# Patient Record
Sex: Female | Born: 1935 | Race: White | Hispanic: No | Marital: Married | Smoking: Never smoker
Health system: Southern US, Community
[De-identification: ages and names within clinical notes are randomized; demographics above are authoritative.]

## PROBLEM LIST (undated history)

## (undated) DIAGNOSIS — K219 Gastro-esophageal reflux disease without esophagitis: Secondary | ICD-10-CM

## (undated) DIAGNOSIS — M1990 Unspecified osteoarthritis, unspecified site: Secondary | ICD-10-CM

## (undated) DIAGNOSIS — I639 Cerebral infarction, unspecified: Secondary | ICD-10-CM

## (undated) DIAGNOSIS — Z9889 Other specified postprocedural states: Secondary | ICD-10-CM

## (undated) DIAGNOSIS — R112 Nausea with vomiting, unspecified: Secondary | ICD-10-CM

## (undated) DIAGNOSIS — G709 Myoneural disorder, unspecified: Secondary | ICD-10-CM

## (undated) DIAGNOSIS — F419 Anxiety disorder, unspecified: Secondary | ICD-10-CM

## (undated) DIAGNOSIS — B001 Herpesviral vesicular dermatitis: Secondary | ICD-10-CM

## (undated) DIAGNOSIS — K859 Acute pancreatitis without necrosis or infection, unspecified: Secondary | ICD-10-CM

## (undated) DIAGNOSIS — K529 Noninfective gastroenteritis and colitis, unspecified: Secondary | ICD-10-CM

## (undated) HISTORY — PX: TONSILLECTOMY: SUR1361

## (undated) HISTORY — PX: DILATION AND CURETTAGE OF UTERUS: SHX78

## (undated) HISTORY — PX: APPENDECTOMY: SHX54

## (undated) HISTORY — PX: CHOLECYSTECTOMY: SHX55

## (undated) HISTORY — PX: BACK SURGERY: SHX140

## (undated) HISTORY — PX: ABDOMINAL HYSTERECTOMY: SHX81

---

## 1999-09-17 ENCOUNTER — Encounter: Payer: Self-pay | Admitting: Rheumatology

## 1999-09-17 ENCOUNTER — Encounter: Admission: RE | Admit: 1999-09-17 | Discharge: 1999-09-17 | Payer: Self-pay | Admitting: Rheumatology

## 2000-09-20 ENCOUNTER — Encounter: Payer: Self-pay | Admitting: Rheumatology

## 2000-09-20 ENCOUNTER — Encounter: Admission: RE | Admit: 2000-09-20 | Discharge: 2000-09-20 | Payer: Self-pay | Admitting: Rheumatology

## 2000-09-20 ENCOUNTER — Encounter: Admission: RE | Admit: 2000-09-20 | Discharge: 2000-09-20 | Payer: Self-pay | Admitting: Internal Medicine

## 2000-09-20 ENCOUNTER — Encounter: Payer: Self-pay | Admitting: Internal Medicine

## 2000-11-04 ENCOUNTER — Ambulatory Visit (HOSPITAL_COMMUNITY): Admission: RE | Admit: 2000-11-04 | Discharge: 2000-11-04 | Payer: Self-pay | Admitting: Gastroenterology

## 2001-09-21 ENCOUNTER — Encounter: Admission: RE | Admit: 2001-09-21 | Discharge: 2001-09-21 | Payer: Self-pay | Admitting: Internal Medicine

## 2001-09-21 ENCOUNTER — Encounter: Payer: Self-pay | Admitting: Internal Medicine

## 2003-03-13 ENCOUNTER — Encounter: Admission: RE | Admit: 2003-03-13 | Discharge: 2003-03-13 | Payer: Self-pay | Admitting: Internal Medicine

## 2003-03-13 ENCOUNTER — Encounter: Payer: Self-pay | Admitting: Internal Medicine

## 2003-09-13 ENCOUNTER — Encounter: Admission: RE | Admit: 2003-09-13 | Discharge: 2003-09-13 | Payer: Self-pay | Admitting: Internal Medicine

## 2004-02-27 ENCOUNTER — Encounter: Admission: RE | Admit: 2004-02-27 | Discharge: 2004-02-27 | Payer: Self-pay | Admitting: Internal Medicine

## 2004-03-31 ENCOUNTER — Encounter: Admission: RE | Admit: 2004-03-31 | Discharge: 2004-03-31 | Payer: Self-pay | Admitting: Internal Medicine

## 2005-05-11 ENCOUNTER — Encounter: Admission: RE | Admit: 2005-05-11 | Discharge: 2005-05-11 | Payer: Self-pay | Admitting: Internal Medicine

## 2006-07-19 ENCOUNTER — Encounter: Admission: RE | Admit: 2006-07-19 | Discharge: 2006-07-19 | Payer: Self-pay | Admitting: Internal Medicine

## 2007-08-02 ENCOUNTER — Encounter: Admission: RE | Admit: 2007-08-02 | Discharge: 2007-08-02 | Payer: Self-pay | Admitting: Internal Medicine

## 2007-08-11 ENCOUNTER — Encounter: Admission: RE | Admit: 2007-08-11 | Discharge: 2007-08-11 | Payer: Self-pay | Admitting: Internal Medicine

## 2007-10-06 ENCOUNTER — Encounter: Admission: RE | Admit: 2007-10-06 | Discharge: 2007-10-06 | Payer: Self-pay | Admitting: Gastroenterology

## 2008-08-05 ENCOUNTER — Encounter: Admission: RE | Admit: 2008-08-05 | Discharge: 2008-08-05 | Payer: Self-pay | Admitting: Internal Medicine

## 2009-08-06 ENCOUNTER — Encounter: Admission: RE | Admit: 2009-08-06 | Discharge: 2009-08-06 | Payer: Self-pay | Admitting: Internal Medicine

## 2010-09-10 ENCOUNTER — Observation Stay (HOSPITAL_COMMUNITY)
Admission: EM | Admit: 2010-09-10 | Discharge: 2010-09-11 | Disposition: A | Discharge status: Home / Self Care | Payer: MEDICARE | Attending: Internal Medicine | Admitting: Internal Medicine

## 2010-09-10 ENCOUNTER — Emergency Department (HOSPITAL_COMMUNITY): Payer: MEDICARE

## 2010-09-10 DIAGNOSIS — R61 Generalized hyperhidrosis: Secondary | ICD-10-CM | POA: Insufficient documentation

## 2010-09-10 DIAGNOSIS — R42 Dizziness and giddiness: Principal | ICD-10-CM | POA: Insufficient documentation

## 2010-09-10 DIAGNOSIS — K219 Gastro-esophageal reflux disease without esophagitis: Secondary | ICD-10-CM | POA: Insufficient documentation

## 2010-09-10 DIAGNOSIS — E119 Type 2 diabetes mellitus without complications: Secondary | ICD-10-CM | POA: Insufficient documentation

## 2010-09-10 DIAGNOSIS — F411 Generalized anxiety disorder: Secondary | ICD-10-CM | POA: Insufficient documentation

## 2010-09-10 DIAGNOSIS — E785 Hyperlipidemia, unspecified: Secondary | ICD-10-CM | POA: Insufficient documentation

## 2010-09-10 DIAGNOSIS — R0602 Shortness of breath: Secondary | ICD-10-CM | POA: Insufficient documentation

## 2010-09-10 DIAGNOSIS — R110 Nausea: Secondary | ICD-10-CM | POA: Insufficient documentation

## 2010-09-10 LAB — URINALYSIS, ROUTINE W REFLEX MICROSCOPIC
Ketones, ur: NEGATIVE mg/dL
Specific Gravity, Urine: 1.005 (ref 1.005–1.030)
Urine Glucose, Fasting: NEGATIVE mg/dL

## 2010-09-10 LAB — BASIC METABOLIC PANEL
BUN: 15 mg/dL (ref 6–23)
CO2: 25 mEq/L (ref 19–32)
Creatinine, Ser: 0.93 mg/dL (ref 0.4–1.2)
GFR calc non Af Amer: 59 mL/min — ABNORMAL LOW (ref >60)
Glucose, Bld: 105 mg/dL — ABNORMAL HIGH (ref 70–99)
Potassium: 4.6 mEq/L (ref 3.5–5.1)
Sodium: 141 mEq/L (ref 135–145)

## 2010-09-10 LAB — DIFFERENTIAL
Basophils Relative: 1 % (ref 0–1)
Eosinophils Relative: 5 % (ref 0–5)
Lymphocytes Relative: 34 % (ref 12–46)
Lymphs Abs: 2.3 10*3/uL (ref 0.7–4.0)
Monocytes Absolute: 0.6 10*3/uL (ref 0.1–1.0)
Monocytes Relative: 9 % (ref 3–12)
Neutro Abs: 3.5 10*3/uL (ref 1.7–7.7)
Neutrophils Relative %: 52 % (ref 43–77)

## 2010-09-10 LAB — POCT CARDIAC MARKERS
Myoglobin, poc: 73.5 ng/mL (ref 12–200)
Troponin i, poc: <0.05 ng/mL (ref 0.00–0.09)

## 2010-09-10 LAB — GLUCOSE, CAPILLARY

## 2010-09-10 LAB — CBC
HCT: 36.7 % (ref 36.0–46.0)
MCHC: 33 g/dL (ref 30.0–36.0)
Platelets: 258 10*3/uL (ref 150–400)

## 2010-09-11 ENCOUNTER — Other Ambulatory Visit (HOSPITAL_COMMUNITY): Payer: MEDICARE

## 2010-09-11 LAB — HEMOGLOBIN A1C
Hgb A1c MFr Bld: 5.9 % — ABNORMAL HIGH (ref <5.7)
Mean Plasma Glucose: 123 mg/dL — ABNORMAL HIGH (ref <117)

## 2010-09-11 LAB — LIPID PANEL
Cholesterol: 176 mg/dL (ref 0–200)
LDL Cholesterol: 88 mg/dL (ref 0–99)
Total CHOL/HDL Ratio: 3.4 RATIO
Triglycerides: 178 mg/dL — ABNORMAL HIGH (ref <150)

## 2010-09-11 LAB — COMPREHENSIVE METABOLIC PANEL
ALT: 22 U/L (ref 0–35)
BUN: 14 mg/dL (ref 6–23)
Calcium: 9.2 mg/dL (ref 8.4–10.5)
Creatinine, Ser: 0.95 mg/dL (ref 0.4–1.2)
GFR calc non Af Amer: 58 mL/min — ABNORMAL LOW (ref >60)
Glucose, Bld: 125 mg/dL — ABNORMAL HIGH (ref 70–99)
Sodium: 141 mEq/L (ref 135–145)
Total Protein: 6.1 g/dL (ref 6.0–8.3)

## 2010-09-11 LAB — CBC
Hemoglobin: 11.9 g/dL — ABNORMAL LOW (ref 12.0–15.0)
MCHC: 33 g/dL (ref 30.0–36.0)
Platelets: 281 10*3/uL (ref 150–400)
RBC: 3.76 MIL/uL — ABNORMAL LOW (ref 3.87–5.11)

## 2010-09-11 LAB — CARDIAC PANEL(CRET KIN+CKTOT+MB+TROPI)
CK, MB: 1.9 ng/mL (ref 0.3–4.0)
Relative Index: INVALID (ref 0.0–2.5)
Total CK: 73 U/L (ref 7–177)
Troponin I: 0.01 ng/mL (ref 0.00–0.06)

## 2010-09-11 LAB — GLUCOSE, CAPILLARY
Glucose-Capillary: 104 mg/dL — ABNORMAL HIGH (ref 70–99)
Glucose-Capillary: 133 mg/dL — ABNORMAL HIGH (ref 70–99)
Glucose-Capillary: 114 mg/dL — ABNORMAL HIGH (ref 70–99)

## 2010-09-11 LAB — MAGNESIUM: Magnesium: 2.3 mg/dL (ref 1.5–2.5)

## 2010-09-11 LAB — TSH: TSH: 5.049 u[IU]/mL — ABNORMAL HIGH (ref 0.350–4.500)

## 2010-09-11 LAB — TROPONIN I: Troponin I: <0.01 ng/mL (ref 0.00–0.06)

## 2010-09-11 LAB — CK TOTAL AND CKMB (NOT AT ARMC)
Relative Index: INVALID (ref 0.0–2.5)
Total CK: 78 U/L (ref 7–177)

## 2010-09-18 NOTE — Discharge Summary (Signed)
  [name], Debbie Montoya NO.:  [id number]  MEDICAL RECORD NO.:  [id number]           PATIENT TYPE:  I  LOCATION:  3738                         FACILITY:  [facility]  PHYSICIAN:  Theressa Millard, M.D.    DATE OF BIRTH:  Dec 14, 1935  DATE OF ADMISSION:  09/10/2010 DATE OF DISCHARGE:  09/11/2010                              DISCHARGE SUMMARY   ADMITTING DIAGNOSIS:  Dizziness.  DISCHARGE DIAGNOSES: 1. Lightheadedness, possible neurocardiogenic event. 2. Diabetes mellitus, well controlled, no evidence of hypo or     hyperglycemia. 3. Dyslipidemia. 4. Anxiety. 5. Gastroesophageal reflux disease.  The patient is a 75 year old white female who developed a couple hours of rather severe lightheadedness associated with clamminess and nausea. She eventually came to the emergency department when their automatic blood pressure cuff would not register blood pressure.  HOSPITAL COURSE:  The patient was admitted and initial CT scan of the brain showed no abnormality.  Her orthostatic blood pressure measurements did note about a 30-40 mmHg systolic drop when she stood up.  Subsequently overnight, she felt better without any specific intervention.  She did not get any IV fluids.  She did have numerous PACs and PVCs on her telemetry, but did not feel those symptomatically. She was therefore discharged in improved condition to resume her home medications and to observe.  If she has further spells, we will work her up further for neurocardiogenic syncope.  DISCHARGE MEDICATIONS: 1. ACTOplus met 15/500 one tablet daily. 2. Bupropion XL 300 mg once daily. 3. Fenofibrate 160 mg daily. 4. Meloxicam 15 mg daily. 5. Minocycline 100 mg twice daily. 6. Omeprazole 20 mg daily. 7. Fluoxetine 10 mg daily.  FOLLOWUP:  She has an appointment to see me on November 10, 2010.  If she has further spells of lightheadedness, she will contact us.  DIET:  No added salt.  Modified  carbohydrate.  ACTIVITIES:  As tolerated.     Theressa Millard, M.D.     JO/MEDQ  D:  09/11/2010  T:  09/12/2010  Job:  [id number]  Electronically Signed by Theressa Millard M.D. on 09/18/2010 02:33:28 PM

## 2010-09-28 NOTE — H&P (Signed)
Debbie Montoya, Debbie Montoya NO.:  [id number]  MEDICAL RECORD NO.:  [id number]           PATIENT TYPE:  E  LOCATION:  [facility]                         FACILITY:  [facility]  PHYSICIAN:  Debbie Montoya, MDDATE OF BIRTH:  Dec 09, 1935  DATE OF ADMISSION:  09/10/2010 DATE OF DISCHARGE:                             HISTORY & PHYSICAL   PRIMARY CARE PHYSICIAN:  Theressa Millard, MD.  CHIEF COMPLAINT:  Dizziness.  HISTORY OF PRESENT ILLNESS:  A 75 year old female with known history of diabetes mellitus type 2, hyperlipidemia, anxiety, has been experiencing some dizziness after she drove all the way from [place] to her home. She said she was doing fine while she was driving.  She reached home and around 6:00 p.m., she started developing sudden onset of dizziness which increased on standing up and positional, had some associated nausea. Denies any chest pain, shortness of breath, loss of consciousness, any visual symptoms, or focal deficit.  In the ER, the patient had an EKG, which shows premature atrial complexes, otherwise, so far workup has been negative.  The patient has been admitted for further observation.  The patient denies any abdominal pain, dysuria, discharge, diarrhea, fever, chills, cough, or phlegm.  The patient has been recently treated with minocycline for abdominal wound infection, which has gotten better.  PAST MEDICAL HISTORY: 1. Diabetes mellitus type 2. 2. Hyperlipidemia. 3. Anxiety. 4. Depression.  PAST SURGICAL HISTORY: 1. Tonsillectomy. 2. Appendectomy. 3. Cholecystectomy. 4. Hysterectomy.  ALLERGIES:  PENICILLIN.  MEDICATIONS PRIOR TO ADMISSION:  The patient is on Actoplus Met, she takes 15/500 two tablets a day; fenofibrate 160 mg daily, fluoxetine 10 mg daily, bupropion XL 300 mg daily, meloxicam, minocycline, Prilosec over-the-counter.  FAMILY HISTORY:  Significant for coronary artery disease.  SOCIAL HISTORY:  The patient denies  smoking cigarettes, drinks alcohol very occasionally.  Denies any drug abuse.  Married, lives with her husband.  REVIEW OF SYSTEMS:  As per history of present illness, nothing else significant.  PHYSICAL EXAMINATION:  GENERAL:  The patient examined at bedside, not in acute distress. VITAL SIGNS:  Blood pressure 134/60 on standing, 134/54 on sitting, 170/60 with lying; pulse 88 per minute and was also same on lying, sitting, and standing; temperature was 98.1; respirations 18; room air O2 sat 98%. HEENT:  Anicteric.  No pallor.  No discharge from ears, eyes, nose, or mouth. CHEST:  Bilateral air entry present.  No rhonchi.  No crepitation. HEART:  S1 and S2 heard. ABDOMEN:  Soft, nontender.  Bowel sounds heard. CNS:  The patient is alert, awake, and oriented to time, place, and person.  Moves upper and lower extremities 5/5.  There is no facial asymmetry.  Tongue is midline.  PLA positive.  There is no pronator drift.  There is no dysdiadochokinesia or attacks. EXTREMITIES:  Peripheral pulses felt.  No edema.  LABS:  EKG shows normal sinus rhythm with PACs, heart rate is around 74 beats per minute.  I did discuss his EKG with his cardiologist on-call. CBC:  WBC 6.7, hemoglobin is 12.1, hematocrit is 36.7, platelets 258. Basic metabolic panel:  Sodium 141, potassium 4.6,  chloride 107, carbon dioxide 25, glucose 105, BUN 15, creatinine 0.9, calcium 9.2, CK-MB is 1.6, troponin less than 0.05, myoglobin is 103.5, alcohol less than 5. UA is negative.  ASSESSMENT: 1. Dizziness. 2. Diabetes mellitus type 2. 3. EKG showing premature atrial complexes. 4. Recent treatment for abdominal boil. 5. History of anxiety and depression.  PLAN: 1. At this time, we will admit the patient to telemetry to rule out     any further arrhythmias. 2. We will get a CT head; and in the a.m., we will also get an MRI of     the brain. 3. The patient will get orthostatic vital signs checked in the      morning. 4. We will get a PT/OT consult. 5. We will get a 2-D echo. 6. If all workup is negative, then we may try meclizine; and if that     fails, we may try Ativan.  Further recommendation as condition     evolves.     Debbie Clos, MD     ANK/MEDQ  D:  09/10/2010  T:  09/11/2010  Job:  [id number]  cc:   Theressa Millard, M.D.  Electronically Signed by Midge Minium MD on 09/28/2010 04:50:36 PM

## 2010-12-18 NOTE — Procedures (Signed)
[facility]  Patient:    Debbie Montoya, Debbie Montoya                          MRN: [id number] Proc. Date: 11/04/00 Attending:  Verlin Grills, M.D. CC:         Barbette Hair. Vaughan Basta., M.D.   Procedure Report  REFERRING PHYSICIAN:  Barbette Hair. Vaughan Basta., M.D.  INDICATION FOR PROCEDURE:  [name] is a 75 year old female.  She is due for surveillance colonoscopy and possible polypectomy to prevent colon cancer.  She last underwent a colonoscopic exam to remove neoplastic but noncancerous polyps approximately five years ago.  ENDOSCOPIST:  Verlin Grills, M.D.  PREMEDICATION:  Versed 7.5 mg, Fentanyl 50 mcg.  ENDOSCOPE:  Olympus pediatric colonoscope.  DESCRIPTION OF PROCEDURE:  After obtaining informed consent, the patient was placed in the left lateral decubitus position.  I administered intravenous Fentanyl and intravenous Versed to achieve conscious sedation for the procedure.  The patients blood pressure, oxygen saturation and cardiac rhythm were monitored throughout the procedure and documented in the medical record. Anal inspection was normal.  Digital rectal exam was normal.  The Olympus pediatric video colonoscope was introduced into the rectum and under direct vision, advanced to the cecum as identified by normal appearing ileocecal valve.  Colonic preparation for the exam today was satisfactory.  Rectum:  Normal.  Sigmoid colon and descending colon:  Normal.  Splenic flexure:  Normal.  Transverse colon:  Normal.  Hepatic flexure:  Normal.  Ascending colon:  Normal.  Cecum and ileocecal valve:  Normal.  ASSESSMENT:  Normal proctocolonoscopy to the cecum.  RECOMMENDATIONS:  Repeat colonoscopy in April 2007. DD:  11/04/00 TD:  11/04/00 Job: [id number]

## 2011-03-05 ENCOUNTER — Other Ambulatory Visit: Payer: Self-pay | Admitting: Internal Medicine

## 2011-03-05 DIAGNOSIS — Z1231 Encounter for screening mammogram for malignant neoplasm of breast: Secondary | ICD-10-CM

## 2011-03-12 ENCOUNTER — Ambulatory Visit: Payer: Medicare Other

## 2011-03-22 ENCOUNTER — Ambulatory Visit
Admission: RE | Admit: 2011-03-22 | Discharge: 2011-03-22 | Disposition: A | Discharge status: Home / Self Care | Payer: Medicare Other | Source: Ambulatory Visit | Attending: Internal Medicine | Admitting: Internal Medicine

## 2011-03-22 DIAGNOSIS — Z1231 Encounter for screening mammogram for malignant neoplasm of breast: Secondary | ICD-10-CM

## 2012-03-09 ENCOUNTER — Other Ambulatory Visit: Payer: Self-pay | Admitting: Internal Medicine

## 2012-03-09 DIAGNOSIS — Z1231 Encounter for screening mammogram for malignant neoplasm of breast: Secondary | ICD-10-CM

## 2012-03-29 ENCOUNTER — Ambulatory Visit
Admission: RE | Admit: 2012-03-29 | Discharge: 2012-03-29 | Disposition: A | Discharge status: Home / Self Care | Payer: Medicare Other | Source: Ambulatory Visit | Attending: Internal Medicine | Admitting: Internal Medicine

## 2012-03-29 DIAGNOSIS — Z1231 Encounter for screening mammogram for malignant neoplasm of breast: Secondary | ICD-10-CM

## 2012-04-06 ENCOUNTER — Other Ambulatory Visit: Payer: Self-pay | Admitting: Neurosurgery

## 2012-04-07 ENCOUNTER — Encounter (HOSPITAL_COMMUNITY): Payer: Self-pay | Admitting: Pharmacy Technician

## 2012-04-14 ENCOUNTER — Encounter (HOSPITAL_COMMUNITY): Payer: Self-pay

## 2012-04-14 ENCOUNTER — Encounter (HOSPITAL_COMMUNITY)
Admission: RE | Admit: 2012-04-14 | Discharge: 2012-04-14 | Disposition: A | Discharge status: Home / Self Care | Payer: Medicare Other | Source: Ambulatory Visit | Attending: Neurosurgery | Admitting: Neurosurgery

## 2012-04-14 HISTORY — DX: Unspecified osteoarthritis, unspecified site: M19.90

## 2012-04-14 HISTORY — DX: Anxiety disorder, unspecified: F41.9

## 2012-04-14 HISTORY — DX: Cerebral infarction, unspecified: I63.9

## 2012-04-14 HISTORY — DX: Myoneural disorder, unspecified: G70.9

## 2012-04-14 HISTORY — DX: Gastro-esophageal reflux disease without esophagitis: K21.9

## 2012-04-14 HISTORY — DX: Acute pancreatitis without necrosis or infection, unspecified: K85.90

## 2012-04-14 LAB — TYPE AND SCREEN: ABO/RH(D): A POS

## 2012-04-14 LAB — CBC
MCH: 31.4 pg (ref 26.0–34.0)
Platelets: 358 10*3/uL (ref 150–400)
RBC: 4.55 MIL/uL (ref 3.87–5.11)
RDW: 12.2 % (ref 11.5–15.5)
WBC: 7.5 10*3/uL (ref 4.0–10.5)

## 2012-04-14 LAB — BASIC METABOLIC PANEL
BUN: 20 mg/dL (ref 6–23)
CO2: 28 mEq/L (ref 19–32)
Calcium: 10 mg/dL (ref 8.4–10.5)
Creatinine, Ser: 0.97 mg/dL (ref 0.50–1.10)
GFR calc non Af Amer: 56 mL/min — ABNORMAL LOW (ref >90)
Glucose, Bld: 102 mg/dL — ABNORMAL HIGH (ref 70–99)

## 2012-04-14 LAB — ABO/RH: ABO/RH(D): A POS

## 2012-04-14 NOTE — Progress Notes (Signed)
Pt. Sees Dr. Earl Gala, [facility].  Pt. States she hasn't ever seen a cardiologist- no advanced testing done ever.

## 2012-04-14 NOTE — Pre-Procedure Instructions (Signed)
20 Debbie Montoya  04/14/2012   Your procedure is scheduled on:  04/19/2012  Report to [facility] at 9:00 AM.  Call this number if you have problems the morning of surgery: [phone]   Remember:   Do not eat food or drink liqid:After Midnight.   Take these medicines the morning of surgery with A SIP OF WATER: Wellbutrin, Prozac, pain medicine OK if needed ,Lorazepam if needed , Prilosec, Neurontin    Do not wear jewelry, make-up or nail polish.  Do not wear lotions, powders, or perfumes. You may wear deodorant.  Do not shave 48 hours prior to surgery. Men may shave face and neck.  Do not bring valuables to the hospital.  Contacts, dentures or bridgework may not be worn into surgery.  Leave suitcase in the car. After surgery it may be brought to your room.  For patients admitted to the hospital, checkout time is 11:00 AM the day of discharge.   Patients discharged the day of surgery will not be allowed to drive home.  Name and phone number of your driver: /w spouse  Special Instructions: CHG Shower Use Special Wash: 1/2 bottle night before surgery and 1/2 bottle morning of surgery.   Please read over the following fact sheets that you were given: Pain Booklet, Coughing and Deep Breathing, Blood Transfusion Information, MRSA Information and Surgical Site Infection Prevention

## 2012-04-17 NOTE — Consult Note (Signed)
Anesthesia Chart Review:  Patient is a 75 year old female scheduled for L4-5 posterior lumbar interbody fusion on 04/19/2012 by Dr. Franky Macho.  History includes GERD, anxiety, diabetes mellitus type 2, pancreatitis '80's, hospitalization for  dizziness and orthostasis 09/2010. PCP is Dr. Theressa Millard.    Labs noted.  EKG from 04/14/12 showed SR with bigeminy PACs, minimal voltage criteria for LVH, cannot rule out prior septal infarct.  PACs were more in a trigeminy pattern on 09/10/10, otherwise I don't think her EKG is significantly changed.  She is not on any cardiac/arrhythmia medications.  Her PAT notes indicate that she has never seen a Cardiologist or had any "advanced testing."  I reviewed her history and EKGs with Anesthesiologist Dr. Sampson Goon.  If no new CV symptoms or significant change in her status then anticipate she can proceed as planned.  Shonna Chock, PA-C

## 2012-04-19 ENCOUNTER — Encounter (HOSPITAL_COMMUNITY): Payer: Self-pay | Admitting: Surgery

## 2012-04-19 ENCOUNTER — Inpatient Hospital Stay (HOSPITAL_COMMUNITY): Payer: Medicare Other

## 2012-04-19 ENCOUNTER — Encounter (HOSPITAL_COMMUNITY): Payer: Self-pay | Admitting: *Deleted

## 2012-04-19 ENCOUNTER — Encounter (HOSPITAL_COMMUNITY): Payer: Self-pay | Admitting: Vascular Surgery

## 2012-04-19 ENCOUNTER — Inpatient Hospital Stay (HOSPITAL_COMMUNITY)
Admission: RE | Admit: 2012-04-19 | Discharge: 2012-04-25 | DRG: 460 | Disposition: A | Discharge status: Home Health | Payer: Medicare Other | Source: Ambulatory Visit | Attending: Neurosurgery | Admitting: Neurosurgery

## 2012-04-19 ENCOUNTER — Encounter (HOSPITAL_COMMUNITY)
Admission: RE | Disposition: A | Discharge status: Home Health | Payer: Self-pay | Source: Ambulatory Visit | Attending: Neurosurgery

## 2012-04-19 ENCOUNTER — Inpatient Hospital Stay (HOSPITAL_COMMUNITY): Payer: Medicare Other | Admitting: Vascular Surgery

## 2012-04-19 DIAGNOSIS — F319 Bipolar disorder, unspecified: Secondary | ICD-10-CM | POA: Diagnosis present

## 2012-04-19 DIAGNOSIS — Z79899 Other long term (current) drug therapy: Secondary | ICD-10-CM

## 2012-04-19 DIAGNOSIS — Z01812 Encounter for preprocedural laboratory examination: Secondary | ICD-10-CM

## 2012-04-19 DIAGNOSIS — Z01810 Encounter for preprocedural cardiovascular examination: Secondary | ICD-10-CM

## 2012-04-19 DIAGNOSIS — K5900 Constipation, unspecified: Secondary | ICD-10-CM | POA: Diagnosis not present

## 2012-04-19 DIAGNOSIS — E119 Type 2 diabetes mellitus without complications: Secondary | ICD-10-CM | POA: Diagnosis present

## 2012-04-19 DIAGNOSIS — Q762 Congenital spondylolisthesis: Principal | ICD-10-CM

## 2012-04-19 DIAGNOSIS — M4716 Other spondylosis with myelopathy, lumbar region: Secondary | ICD-10-CM | POA: Diagnosis present

## 2012-04-19 DIAGNOSIS — M47816 Spondylosis without myelopathy or radiculopathy, lumbar region: Secondary | ICD-10-CM | POA: Diagnosis present

## 2012-04-19 DIAGNOSIS — K219 Gastro-esophageal reflux disease without esophagitis: Secondary | ICD-10-CM | POA: Diagnosis present

## 2012-04-19 LAB — GLUCOSE, CAPILLARY

## 2012-04-19 LAB — POCT I-STAT 4, (NA,K, GLUC, HGB,HCT): HCT: 36 % (ref 36.0–46.0)

## 2012-04-19 SURGERY — POSTERIOR LUMBAR FUSION 1 LEVEL
Anesthesia: General | Site: Back | Wound class: Clean

## 2012-04-19 MED ORDER — HYDROCODONE-ACETAMINOPHEN 5-325 MG PO TABS
1.0000 | ORAL_TABLET | ORAL | Status: DC | PRN
  Administered 2012-04-21 – 2012-04-24 (×13): 2 via ORAL
  Filled 2012-04-19 (×13): qty 2

## 2012-04-19 MED ORDER — NALOXONE HCL 0.4 MG/ML IJ SOLN: 0.4000 mg | INTRAMUSCULAR | Status: DC | PRN

## 2012-04-19 MED ORDER — EPHEDRINE SULFATE 50 MG/ML IJ SOLN
INTRAMUSCULAR | Status: DC | PRN
  Administered 2012-04-19: 10 mg via INTRAVENOUS

## 2012-04-19 MED ORDER — PIOGLITAZONE HCL 15 MG PO TABS
15.0000 mg | ORAL_TABLET | Freq: Every day | ORAL | Status: DC
  Administered 2012-04-20 – 2012-04-25 (×7): 15 mg via ORAL
  Filled 2012-04-19 (×7): qty 1

## 2012-04-19 MED ORDER — ROCURONIUM BROMIDE 100 MG/10ML IV SOLN
INTRAVENOUS | Status: DC | PRN
  Administered 2012-04-19: 10 mg via INTRAVENOUS
  Administered 2012-04-19: 20 mg via INTRAVENOUS
  Administered 2012-04-19: 50 mg via INTRAVENOUS

## 2012-04-19 MED ORDER — GABAPENTIN 400 MG PO CAPS
400.0000 mg | ORAL_CAPSULE | Freq: Three times a day (TID) | ORAL | Status: DC
  Administered 2012-04-19 – 2012-04-25 (×17): 400 mg via ORAL
  Filled 2012-04-19 (×20): qty 1

## 2012-04-19 MED ORDER — ONDANSETRON HCL 4 MG/2ML IJ SOLN
4.0000 mg | INTRAMUSCULAR | Status: DC | PRN
  Administered 2012-04-23 – 2012-04-24 (×3): 4 mg via INTRAVENOUS
  Filled 2012-04-19 (×3): qty 2

## 2012-04-19 MED ORDER — HYDROMORPHONE HCL PF 1 MG/ML IJ SOLN
0.2500 mg | INTRAMUSCULAR | Status: DC | PRN
  Administered 2012-04-19 (×4): 0.5 mg via INTRAVENOUS

## 2012-04-19 MED ORDER — BUPROPION HCL ER (SR) 150 MG PO TB12
150.0000 mg | ORAL_TABLET | Freq: Two times a day (BID) | ORAL | Status: DC
  Administered 2012-04-19 – 2012-04-25 (×12): 150 mg via ORAL
  Filled 2012-04-19 (×15): qty 1

## 2012-04-19 MED ORDER — SODIUM CHLORIDE 0.9 % IJ SOLN: 9.0000 mL | INTRAMUSCULAR | Status: DC | PRN

## 2012-04-19 MED ORDER — DIPHENHYDRAMINE HCL 50 MG/ML IJ SOLN: 12.5000 mg | Freq: Four times a day (QID) | INTRAMUSCULAR | Status: DC | PRN

## 2012-04-19 MED ORDER — HYDROMORPHONE HCL PF 1 MG/ML IJ SOLN
INTRAMUSCULAR | Status: AC
  Administered 2012-04-19: 0.5 mg via INTRAVENOUS
  Filled 2012-04-19: qty 1

## 2012-04-19 MED ORDER — MENTHOL 3 MG MT LOZG: 1.0000 | LOZENGE | OROMUCOSAL | Status: DC | PRN

## 2012-04-19 MED ORDER — CEFAZOLIN SODIUM-DEXTROSE 2-3 GM-% IV SOLR: 2.0000 g | INTRAVENOUS | Status: DC

## 2012-04-19 MED ORDER — PROPOFOL 10 MG/ML IV BOLUS
INTRAVENOUS | Status: DC | PRN
  Administered 2012-04-19: 150 mg via INTRAVENOUS

## 2012-04-19 MED ORDER — LIDOCAINE-EPINEPHRINE 0.5 %-1:200000 IJ SOLN
INTRAMUSCULAR | Status: DC | PRN
  Administered 2012-04-19: 20 mL

## 2012-04-19 MED ORDER — SODIUM CHLORIDE 0.9 % IJ SOLN
3.0000 mL | INTRAMUSCULAR | Status: DC | PRN
  Administered 2012-04-24: 09:00:00 via INTRAVENOUS

## 2012-04-19 MED ORDER — ACETAMINOPHEN 650 MG RE SUPP: 650.0000 mg | RECTAL | Status: DC | PRN

## 2012-04-19 MED ORDER — SODIUM CHLORIDE 0.9 % IJ SOLN
3.0000 mL | Freq: Two times a day (BID) | INTRAMUSCULAR | Status: DC
  Administered 2012-04-19 – 2012-04-25 (×9): 3 mL via INTRAVENOUS

## 2012-04-19 MED ORDER — PIOGLITAZONE HCL-METFORMIN HCL 15-500 MG PO TABS: 1.0000 | ORAL_TABLET | Freq: Every day | ORAL | Status: DC

## 2012-04-19 MED ORDER — VANCOMYCIN HCL IN DEXTROSE 1-5 GM/200ML-% IV SOLN
INTRAVENOUS | Status: AC
  Administered 2012-04-19: 1000 mg via INTRAVENOUS
  Filled 2012-04-19: qty 200

## 2012-04-19 MED ORDER — VANCOMYCIN HCL IN DEXTROSE 1-5 GM/200ML-% IV SOLN
1000.0000 mg | Freq: Once | INTRAVENOUS | Status: AC
  Administered 2012-04-20: 1000 mg via INTRAVENOUS
  Filled 2012-04-19: qty 200

## 2012-04-19 MED ORDER — 0.9 % SODIUM CHLORIDE (POUR BTL) OPTIME
TOPICAL | Status: DC | PRN
  Administered 2012-04-19: 1000 mL

## 2012-04-19 MED ORDER — NEOSTIGMINE METHYLSULFATE 1 MG/ML IJ SOLN
INTRAMUSCULAR | Status: DC | PRN
  Administered 2012-04-19: 5 mg via INTRAVENOUS

## 2012-04-19 MED ORDER — PHENYLEPHRINE HCL 10 MG/ML IJ SOLN
INTRAMUSCULAR | Status: DC | PRN
  Administered 2012-04-19 (×3): 40 ug via INTRAVENOUS

## 2012-04-19 MED ORDER — DIAZEPAM 5 MG PO TABS
5.0000 mg | ORAL_TABLET | Freq: Four times a day (QID) | ORAL | Status: DC | PRN
  Administered 2012-04-21 – 2012-04-25 (×9): 5 mg via ORAL
  Filled 2012-04-19 (×9): qty 1

## 2012-04-19 MED ORDER — FENOFIBRATE 160 MG PO TABS
160.0000 mg | ORAL_TABLET | Freq: Every day | ORAL | Status: DC
  Administered 2012-04-20 – 2012-04-25 (×6): 160 mg via ORAL
  Filled 2012-04-19 (×7): qty 1

## 2012-04-19 MED ORDER — THROMBIN 20000 UNITS EX KIT
PACK | CUTANEOUS | Status: DC | PRN
  Administered 2012-04-19: 20000 [IU] via TOPICAL

## 2012-04-19 MED ORDER — OXYCODONE-ACETAMINOPHEN 5-325 MG PO TABS
1.0000 | ORAL_TABLET | ORAL | Status: DC | PRN
  Filled 2012-04-19: qty 2

## 2012-04-19 MED ORDER — HYDROMORPHONE HCL PF 1 MG/ML IJ SOLN
0.5000 mg | INTRAMUSCULAR | Status: DC | PRN
  Administered 2012-04-19: 0.5 mg via INTRAVENOUS

## 2012-04-19 MED ORDER — HEMOSTATIC AGENTS (NO CHARGE) OPTIME
TOPICAL | Status: DC | PRN
  Administered 2012-04-19: 1 via TOPICAL

## 2012-04-19 MED ORDER — HYDROMORPHONE HCL PF 1 MG/ML IJ SOLN
INTRAMUSCULAR | Status: AC
  Filled 2012-04-19: qty 1

## 2012-04-19 MED ORDER — LORAZEPAM 0.5 MG PO TABS: 0.5000 mg | ORAL_TABLET | Freq: Two times a day (BID) | ORAL | Status: DC | PRN

## 2012-04-19 MED ORDER — MORPHINE SULFATE (PF) 1 MG/ML IV SOLN
INTRAVENOUS | Status: AC
  Administered 2012-04-19: 20:00:00 via INTRAVENOUS
  Filled 2012-04-19: qty 25

## 2012-04-19 MED ORDER — FLUOXETINE HCL 10 MG PO CAPS
10.0000 mg | ORAL_CAPSULE | Freq: Every day | ORAL | Status: DC
  Administered 2012-04-20 – 2012-04-25 (×6): 10 mg via ORAL
  Filled 2012-04-19 (×7): qty 1

## 2012-04-19 MED ORDER — DIPHENHYDRAMINE HCL 12.5 MG/5ML PO ELIX: 12.5000 mg | ORAL_SOLUTION | Freq: Four times a day (QID) | ORAL | Status: DC | PRN

## 2012-04-19 MED ORDER — PANTOPRAZOLE SODIUM 40 MG PO TBEC
40.0000 mg | DELAYED_RELEASE_TABLET | Freq: Every day | ORAL | Status: DC
  Administered 2012-04-20 – 2012-04-25 (×6): 40 mg via ORAL
  Filled 2012-04-19 (×6): qty 1

## 2012-04-19 MED ORDER — METFORMIN HCL 500 MG PO TABS
500.0000 mg | ORAL_TABLET | Freq: Every day | ORAL | Status: DC
  Administered 2012-04-20 – 2012-04-25 (×6): 500 mg via ORAL
  Filled 2012-04-19 (×7): qty 1

## 2012-04-19 MED ORDER — ONDANSETRON HCL 4 MG/2ML IJ SOLN
INTRAMUSCULAR | Status: DC | PRN
  Administered 2012-04-19: 4 mg via INTRAVENOUS

## 2012-04-19 MED ORDER — POTASSIUM CHLORIDE IN NACL 20-0.9 MEQ/L-% IV SOLN
INTRAVENOUS | Status: DC
  Administered 2012-04-19 – 2012-04-21 (×3): via INTRAVENOUS
  Filled 2012-04-19 (×14): qty 1000

## 2012-04-19 MED ORDER — PHENOL 1.4 % MT LIQD: 1.0000 | OROMUCOSAL | Status: DC | PRN

## 2012-04-19 MED ORDER — CEFAZOLIN SODIUM-DEXTROSE 2-3 GM-% IV SOLR
INTRAVENOUS | Status: AC
  Filled 2012-04-19: qty 50

## 2012-04-19 MED ORDER — MORPHINE SULFATE (PF) 1 MG/ML IV SOLN
INTRAVENOUS | Status: DC
  Administered 2012-04-20: 7.5 mg via INTRAVENOUS
  Administered 2012-04-20: 6 mg via INTRAVENOUS
  Administered 2012-04-20 (×2): via INTRAVENOUS
  Filled 2012-04-19 (×2): qty 25

## 2012-04-19 MED ORDER — ACETAMINOPHEN 325 MG PO TABS
650.0000 mg | ORAL_TABLET | ORAL | Status: DC | PRN
  Administered 2012-04-20 – 2012-04-21 (×2): 650 mg via ORAL
  Filled 2012-04-19 (×2): qty 2

## 2012-04-19 MED ORDER — SODIUM CHLORIDE 0.9 % IV SOLN
250.0000 mL | INTRAVENOUS | Status: DC
  Administered 2012-04-19: 250 mL via INTRAVENOUS

## 2012-04-19 MED ORDER — CEFAZOLIN SODIUM 1-5 GM-% IV SOLN: 1.0000 g | Freq: Three times a day (TID) | INTRAVENOUS | Status: DC

## 2012-04-19 MED ORDER — LIDOCAINE HCL (CARDIAC) 20 MG/ML IV SOLN
INTRAVENOUS | Status: DC | PRN
  Administered 2012-04-19: 40 mg via INTRAVENOUS

## 2012-04-19 MED ORDER — FENTANYL CITRATE 0.05 MG/ML IJ SOLN
INTRAMUSCULAR | Status: DC | PRN
  Administered 2012-04-19: 50 ug via INTRAVENOUS
  Administered 2012-04-19 (×4): 100 ug via INTRAVENOUS
  Administered 2012-04-19: 50 ug via INTRAVENOUS

## 2012-04-19 MED ORDER — LACTATED RINGERS IV SOLN
INTRAVENOUS | Status: DC | PRN
  Administered 2012-04-19 (×3): via INTRAVENOUS

## 2012-04-19 MED ORDER — ONDANSETRON HCL 4 MG/2ML IJ SOLN: 4.0000 mg | Freq: Four times a day (QID) | INTRAMUSCULAR | Status: DC | PRN

## 2012-04-19 MED ORDER — GLYCOPYRROLATE 0.2 MG/ML IJ SOLN
INTRAMUSCULAR | Status: DC | PRN
  Administered 2012-04-19: .6 mg via INTRAVENOUS

## 2012-04-19 SURGICAL SUPPLY — 71 items
30 mm pre-bent rod ×1 IMPLANT
5.0x30mm shanks ×1 IMPLANT
ADH SKN CLS APL DERMABOND .7 (GAUZE/BANDAGES/DRESSINGS) ×1
ADH SKN CLS LQ APL DERMABOND (GAUZE/BANDAGES/DRESSINGS) ×1
APL SKNCLS STERI-STRIP NONHPOA (GAUZE/BANDAGES/DRESSINGS)
BALL ROD SINGLE 5.5X30 (Rod) ×1 IMPLANT
BENZOIN TINCTURE PRP APPL 2/3 (GAUZE/BANDAGES/DRESSINGS) IMPLANT
BLADE SURG ROTATE 9660 (MISCELLANEOUS) IMPLANT
BONE MATRIX OSTEOCEL PLUS 10CC (Bone Implant) ×1 IMPLANT
BUR MATCHSTICK NEURO 3.0 LAGG (BURR) ×2 IMPLANT
CAGE SPINAL COROENT MP 11X28X9 (Cage) ×1 IMPLANT
CANISTER SUCTION 2500CC (MISCELLANEOUS) ×2 IMPLANT
CLOTH BEACON ORANGE TIMEOUT ST (SAFETY) ×2 IMPLANT
CONT SPEC 4OZ CLIKSEAL STRL BL (MISCELLANEOUS) ×2 IMPLANT
COROENT MP 11X28X9MM (Cage) ×2 IMPLANT
COVER BACK TABLE 24X17X13 BIG (DRAPES) IMPLANT
COVER TABLE BACK 60X90 (DRAPES) ×1 IMPLANT
DECANTER SPIKE VIAL GLASS SM (MISCELLANEOUS) ×2 IMPLANT
DERMABOND ADHESIVE PROPEN (GAUZE/BANDAGES/DRESSINGS) ×1
DERMABOND ADVANCED (GAUZE/BANDAGES/DRESSINGS) ×1
DERMABOND ADVANCED .7 DNX12 (GAUZE/BANDAGES/DRESSINGS) ×1 IMPLANT
DERMABOND ADVANCED .7 DNX6 (GAUZE/BANDAGES/DRESSINGS) ×1 IMPLANT
DRAPE C-ARM 42X72 X-RAY (DRAPES) ×6 IMPLANT
DRAPE C-ARMOR (DRAPES) ×2 IMPLANT
DRAPE LAPAROTOMY 100X72X124 (DRAPES) ×2 IMPLANT
DRAPE POUCH INSTRU U-SHP 10X18 (DRAPES) ×2 IMPLANT
DRAPE SURG 17X23 STRL (DRAPES) ×2 IMPLANT
DRESSING TELFA 8X3 (GAUZE/BANDAGES/DRESSINGS) ×2 IMPLANT
DURAPREP 26ML APPLICATOR (WOUND CARE) ×2 IMPLANT
ELECT REM PT RETURN 9FT ADLT (ELECTROSURGICAL) ×2
ELECTRODE REM PT RTRN 9FT ADLT (ELECTROSURGICAL) ×1 IMPLANT
GAUZE SPONGE 4X4 16PLY XRAY LF (GAUZE/BANDAGES/DRESSINGS) IMPLANT
GLOVE BIOGEL PI IND STRL 8 (GLOVE) IMPLANT
GLOVE BIOGEL PI INDICATOR 8 (GLOVE) ×1
GLOVE ECLIPSE 6.5 STRL STRAW (GLOVE) ×4 IMPLANT
GLOVE ECLIPSE 7.5 STRL STRAW (GLOVE) ×4 IMPLANT
GLOVE EXAM NITRILE LRG STRL (GLOVE) IMPLANT
GLOVE EXAM NITRILE MD LF STRL (GLOVE) IMPLANT
GLOVE EXAM NITRILE XL STR (GLOVE) IMPLANT
GLOVE EXAM NITRILE XS STR PU (GLOVE) IMPLANT
GOWN BRE IMP SLV AUR LG STRL (GOWN DISPOSABLE) ×2 IMPLANT
GOWN BRE IMP SLV AUR XL STRL (GOWN DISPOSABLE) ×1 IMPLANT
GOWN STRL REIN 2XL LVL4 (GOWN DISPOSABLE) ×1 IMPLANT
KIT BASIN OR (CUSTOM PROCEDURE TRAY) ×2 IMPLANT
KIT POSITION SURG JACKSON T1 (MISCELLANEOUS) ×2 IMPLANT
KIT ROOM TURNOVER OR (KITS) ×2 IMPLANT
LIGHT SOURCE ANGLE TIP STR 7FT (MISCELLANEOUS) ×2 IMPLANT
NEEDLE HYPO 25X1 1.5 SAFETY (NEEDLE) ×2 IMPLANT
NEEDLE SPNL 18GX3.5 QUINCKE PK (NEEDLE) ×2 IMPLANT
NS IRRIG 1000ML POUR BTL (IV SOLUTION) ×2 IMPLANT
PACK LAMINECTOMY NEURO (CUSTOM PROCEDURE TRAY) ×2 IMPLANT
PAD ARMBOARD 7.5X6 YLW CONV (MISCELLANEOUS) ×5 IMPLANT
SCREW LOCK (Screw) ×8 IMPLANT
SCREW LOCK FXNS SPNE MAS PL (Screw) IMPLANT
SCREW SHANK 5.0X35 (Screw) ×3 IMPLANT
SCREW TULIP 5.5 (Screw) ×8 IMPLANT
SPONGE GAUZE 4X4 12PLY (GAUZE/BANDAGES/DRESSINGS) IMPLANT
SPONGE LAP 4X18 X RAY DECT (DISPOSABLE) IMPLANT
SPONGE SURGIFOAM ABS GEL 100 (HEMOSTASIS) ×2 IMPLANT
STRIP CLOSURE SKIN 1/2X4 (GAUZE/BANDAGES/DRESSINGS) IMPLANT
SUT PROLENE 6 0 BV (SUTURE) IMPLANT
SUT VIC AB 0 CT1 18XCR BRD8 (SUTURE) ×1 IMPLANT
SUT VIC AB 0 CT1 8-18 (SUTURE) ×2
SUT VIC AB 2-0 CT1 18 (SUTURE) ×2 IMPLANT
SUT VIC AB 3-0 SH 8-18 (SUTURE) ×2 IMPLANT
SYR 20ML ECCENTRIC (SYRINGE) ×2 IMPLANT
TOWEL OR 17X24 6PK STRL BLUE (TOWEL DISPOSABLE) ×2 IMPLANT
TOWEL OR 17X26 10 PK STRL BLUE (TOWEL DISPOSABLE) ×2 IMPLANT
TRAY FOLEY CATH 14FRSI W/METER (CATHETERS) ×2 IMPLANT
WATER STERILE IRR 1000ML POUR (IV SOLUTION) ×2 IMPLANT
coroent implant 12 x 28 x 4 degree ×1 IMPLANT

## 2012-04-19 NOTE — Progress Notes (Signed)
While reviewing home medication list patient informed Nurse that she took her Actosplus Metformin diabetic medication this morning at 0800. Nurse explained to patient that all diabetic medications were to be held the morning of surgery due to NPO status. Patient stated "it wasn't on my list of medications to take, but I thought the Nurse just forgot to tell me to take it." Currently blood sugar is 99. Patient instructed to notify Nurse immediately if her blood sugar dropped. Patient unaware of signs of low glucose and stated "my sugar has never been low." Signs of hypoglycemia taught to patient. Patient verbalized understanding. Husband and friend at bedside. Will continue to closely monitor.

## 2012-04-19 NOTE — Anesthesia Procedure Notes (Signed)
Procedure Name: Intubation Performed by: Sheppard Evens Pre-anesthesia Checklist: Patient identified, Emergency Drugs available, Suction available and Patient being monitored Patient Re-evaluated:Patient Re-evaluated prior to inductionOxygen Delivery Method: Circle system utilized Preoxygenation: Pre-oxygenation with 100% oxygen Intubation Type: IV induction Ventilation: Mask ventilation without difficulty Laryngoscope Size: Mac and 3 Grade View: Grade II Tube type: Oral Tube size: 7.5 mm Number of attempts: 1 Placement Confirmation: ETT inserted through vocal cords under direct vision,  positive ETCO2 and breath sounds checked- equal and bilateral Secured at: 21 cm Tube secured with: Tape Dental Injury: Teeth and Oropharynx as per pre-operative assessment

## 2012-04-19 NOTE — H&P (Signed)
BP 135/73  Pulse 56  Temp 97.8 F (36.6 C) (Oral)  Resp 18  SpO2 98%  HISTORY OF PRESENT ILLNESS:  Debbie Montoya is an old patient of Dr. Loraine Leriche Roy's whom he last saw in December of 2009.  At that time, he felt that she had significant degeneration at L5-S1 and early degenerative changes at L4-5.  At that time, she did not want an operation and he did not think one was indicated and he wanted her to do rehabilitation.  She is now complaining of pain again which has gotten worse over the last three weeks or so.  She has lived with a very low level of pain but this is something that has just gotten worse.  She has used a cane now for approximately one month.  She only has pain in the left lower extremity.  Minimal back pain.  She has had multiple injections into the lumbar spine.  She speaks very strongly about the fact that she has a lot of pain in her left calf.  She does have a new MRI and is sent for evaluation of her back and lower extremity pain.  Debbie Montoya is 76 years of age, right-handed and currently retired.    In her own words, she says the left leg, sciatic nerve, especially calf hurt.  She did undergo 3 epidural injections by Dr. Claria Dice.  She says it feels like a cramp in her leg.  She started having these symptoms in May of 2013.  When she was previously treated by Dr. Channing Mutters, this was not the case and she had pain in the back, hips and going down both lower extremities.    PAST MEDICAL HISTORY:   Significant for diabetes and pancreatitis.  FAMILY HISTORY:    Mother died at age 47 secondary to chronic obstructive pulmonary disease.  Father died at age 55.  He had prostate cancer.     PAST SURGICAL HISTORY:   She has undergone appendectomy, cholecystec-tomy, and hysterectomy.   ALLERGIES:     SHE HAS AN ALLERGY TO PENICILLIN.  SOCIAL HISTORY:    She does not smoke.  She does drink alcohol.  She does not use illicit drugs.  Her weight has been stable.  She weighs 178 pounds, has a  pulse of 64 and is 161 cm. in height.   REVIEW OF SYSTEMS:   Positive for eyeglasses, hypercholesterolemia, leg pain with walking, nausea, anxiety, depression and diabetes.  She denies constitutional, ears, nose, throat,  mouth, respiratory, GU, musculoskeletal, skin, neurological, hematologic and allergic problems.    MEDICATIONS:    Gabapentin, Actoplus, Fenofibrate, Fluoxetine, Bupropion, Prilosec, Lorazepam, Valacyclovir, Percocet and Andesterone (?sp).    PHYSICAL EXAMINATION:   On examination, she is alert and oriented x four and answering all questions appropriately.  Memory, language, attention span and fund of knowledge are normal.  Speech is clear and fluent.  Pupils are equal, round, and reactive to light.  Full EOM's and full visual fields.  Symmetric facial sensation and movement.  Hearing is intact to finger rub bilaterally.  Uvula elevates in the midline.  Shoulder shrug is normal.  Tongue protrudes in the midline.  2+ reflexes in the biceps, triceps, brachioradialis, knees and ankles.  Normal muscle tone, bulk and coordination.  She has significant tenderness on direct palpation of the left calf.  She has tenderness in the left leg with forceable dorsiflexion of the left foot. She has full strength in the left foot.  Negative straight  leg raising.  Negative Patrick's maneuver bilaterally.  Downgoing toes to plantar stimulation.  No Hoffmann's sign and no clonus.  Gait is antalgic favoring the left lower extremity.  No cervical masses or bruits.  Lung fields are clear.  Heart: regular rate and rhythm.  No murmurs or rubs.  Pulses are good at the wrists bilaterally.  Sclerae are not injected.  Oral mucosa is normal.  Head is normocephalic and atraumatic.    Assessment/Plan . She may very well wind up with the same calf pain afterwards, but the back pain and the other problems I think would improve.  Given that, she would like to proceed with an L4-5 lumbar arthrodesis, posterolateral fusion and  pedicle screws. This would be done on September 18th.  Risks and benefits were explained. She understands that this may do nothing at all for her pain. She may not fuse, she may not heal, bleeding and infection. I gave her a detailed instruction sheet going over all these things.    DIAGNOSTIC STUDIES:   MRI is reviewed and shows significant foraminal narrowing on the left side at L4-5. It is present on the right side but it is worse on the left there.  She is stenotic at that level and has degenerative changes present and some Modic changes present at L4-5.  She has degenerated disc at L5-S1 but does not have the foraminal narrowing as prominent there as she does at the 4-5 level.  She has significant facet hypertrophy at the L4-5 level.  She also has an anterolisthesis of L4 on L5.  These are changes which are new since her last study.

## 2012-04-19 NOTE — Anesthesia Preprocedure Evaluation (Addendum)
Anesthesia Evaluation  Patient identified by MRN, date of birth, ID band Patient awake    Reviewed: Allergy & Precautions  Airway Mallampati: II      Dental   Pulmonary neg pulmonary ROS,  breath sounds clear to auscultation        Cardiovascular negative cardio ROS  Rhythm:Regular Rate:Normal     Neuro/Psych Anxiety Depression Bipolar Disorder Dizziness work up negative per patient. Her medical doctor evaluated her symptoms and no further work up needed per patient. CE    GI/Hepatic GERD-  Controlled,  Endo/Other  diabetes, Type 2  Renal/GU      Musculoskeletal   Abdominal   Peds  Hematology negative hematology ROS (+)   Anesthesia Other Findings   Reproductive/Obstetrics                          Anesthesia Physical Anesthesia Plan  ASA: III  Anesthesia Plan: General   Post-op Pain Management:    Induction: Intravenous  Airway Management Planned: Oral ETT  Additional Equipment:   Intra-op Plan:   Post-operative Plan: Extubation in OR  Informed Consent: I have reviewed the patients History and Physical, chart, labs and discussed the procedure including the risks, benefits and alternatives for the proposed anesthesia with the patient or authorized representative who has indicated his/her understanding and acceptance.     Plan Discussed with: CRNA, Anesthesiologist and Surgeon  Anesthesia Plan Comments:         Anesthesia Quick Evaluation

## 2012-04-19 NOTE — Op Note (Signed)
04/19/2012  6:33 PM  PATIENT:  ERICHA Montoya  76 y.o. female not responsive to conservative treatment for back and left  lower extremity pain. She has opted for lumbar fusion and decompression with no guarantee of left calf pain improvement  PRE-OPERATIVE DIAGNOSIS:  spondylolisthesis lumbar spondylosis with myelopathy lumbar radiculopathy  POST-OPERATIVE DIAGNOSIS:  spondylolisthesis lumbar spondylosis with myelopathy lumbar radiculopathy  PROCEDURE:  Procedure(s):L4/5 POSTERIOR LUMBAR Interbody FUSION 1 LEVEL Nuvasive Peek cage 12mm on the left Decompression L4/5 left beyond what is needed for a PlIF Pedicle screw fixation Nuvasive masplif system L4/5 SURGEON:  Surgeon(s): Carmela Hurt, MD Tia Alert, MD  ASSISTANTS: Marikay Alar  ANESTHESIA:   general  EBL:  Total I/O In: 2000 [I.V.:2000] Out: 650 [Urine:550; Blood:100]  BLOOD ADMINISTERED:none  CELL SAVER GIVEN:none  COUNT:per nursing  DRAINS: none   SPECIMEN:  No Specimen  DICTATION: Debbie Montoya was brought to the operating room intubated and placed under a general anesthetic without difficulty. She had a foley catheter placed under sterile conditions. She was positioned on the [place] table with all pressure points padded. Her back was prepped and draped in a sterile manner. I infiltrated lidocaine into the lumbar region. I opened the skin with a 10 blade and took the incision down to the thoracolumbar fascia. I exposed the lamina of L3,4, and 5 bilaterally. With fluoroscopy I confirmed my location. I exposed the pars of L4 and L5 bilaterally. With fluoroscopic guidance I then placed pedicle screws in a medial to lateral trajectory. I placed 35x5.15mm Nuvasive screws in each pedicle except the Left L5 pedicle where I used a 30x5.0 mm screw. Each screw was placed by drilling a pilot hole under fluoroscopy, tapping, and ensuring no breach of the pedicle. The Left L5 screw broke out distally so that is why I used a shorter  screw.  I then started my decompression beyond what was necessary for a plif. I only opened the left side as that was the only side that was causing pain. I performed a complete inferior Left L4 facetectomy and decompressed the neural foramen of the L4 root and L5.  I completed my plif by opening the disc space lateral to the L5 root. I used multiple instruments for my discetomy, and was aggressive in removing soft tissue from the disc space. With Dr. Yetta Barre' assistance we prepared the endplates and disc space. We placed a 12mm peek interbody nuvasive cage on the left side. Fluoroscopy showed the cage in satisfactory position. We then placed morselized allo and autograft into the disc space medial to the cage. I performed a posterolateral arthrodesis at L4/5 by decorticating the right side 4/5 facet and packing morselized allo and autograft on the decorticated bone.  I completed the pedicle screw construct with rods and locking caps. Final construct looked good. I irrigated the wound then closed in layers, approximating the thoracolumbar, subcutaneous,and subcuticular tissue. I used dermabond for a sterile dressing. She was placed supine on the stretcher and was extubated in the room.   PLAN OF CARE: Admit to inpatient   PATIENT DISPOSITION:  PACU - hemodynamically stable.   Delay start of Pharmacological VTE agent (>24hrs) due to surgical blood loss or risk of bleeding:  yes

## 2012-04-19 NOTE — Anesthesia Postprocedure Evaluation (Signed)
  Anesthesia Post-op Note  Patient: Debbie Montoya  Procedure(s) Performed: Procedure(s) (LRB) with comments: POSTERIOR LUMBAR FUSION 1 LEVEL (N/A) - Lumbar four five posterior lumbar interbody fusion with interbody prothesis posterolateral arthrodesis and posterior nonsegmental instrumentation  Patient Location: PACU  Anesthesia Type: General  Level of Consciousness: awake, alert  and oriented  Airway and Oxygen Therapy: Patient Spontanous Breathing and Patient connected to nasal cannula oxygen  Post-op Pain: mild  Post-op Assessment: Post-op Vital signs reviewed and Patient's Cardiovascular Status Stable  Post-op Vital Signs: stable  Complications: No apparent anesthesia complications

## 2012-04-19 NOTE — Preoperative (Signed)
Beta Blockers   Reason not to administer Beta Blockers:Not Applicable 

## 2012-04-19 NOTE — Transfer of Care (Signed)
Immediate Anesthesia Transfer of Care Note  Patient: Debbie Montoya  Procedure(s) Performed: Procedure(s) (LRB) with comments: POSTERIOR LUMBAR FUSION 1 LEVEL (N/A) - Lumbar four five posterior lumbar interbody fusion with interbody prothesis posterolateral arthrodesis and posterior nonsegmental instrumentation  Patient Location: PACU  Anesthesia Type: General  Level of Consciousness: awake, alert  and oriented  Airway & Oxygen Therapy: Patient Spontanous Breathing and Patient connected to face mask oxygen  Post-op Assessment: Report given to PACU RN  Post vital signs: Reviewed and stable  Complications: No apparent anesthesia complications

## 2012-04-20 ENCOUNTER — Encounter (HOSPITAL_COMMUNITY): Payer: Self-pay | Admitting: Neurology

## 2012-04-20 LAB — GLUCOSE, CAPILLARY
Glucose-Capillary: 100 mg/dL — ABNORMAL HIGH (ref 70–99)
Glucose-Capillary: 79 mg/dL (ref 70–99)
Glucose-Capillary: 115 mg/dL — ABNORMAL HIGH (ref 70–99)

## 2012-04-20 MED ORDER — NALOXONE HCL 0.4 MG/ML IJ SOLN: 0.4000 mg | INTRAMUSCULAR | Status: DC | PRN

## 2012-04-20 MED ORDER — SODIUM CHLORIDE 0.9 % IJ SOLN: 9.0000 mL | INTRAMUSCULAR | Status: DC | PRN

## 2012-04-20 MED ORDER — DIPHENHYDRAMINE HCL 50 MG/ML IJ SOLN: 12.5000 mg | Freq: Four times a day (QID) | INTRAMUSCULAR | Status: DC | PRN

## 2012-04-20 MED ORDER — MORPHINE SULFATE (PF) 1 MG/ML IV SOLN
INTRAVENOUS | Status: DC
  Administered 2012-04-20: 4 mg via INTRAVENOUS
  Administered 2012-04-21: 5 mg via INTRAVENOUS
  Administered 2012-04-21: 10:00:00 via INTRAVENOUS
  Filled 2012-04-20: qty 25

## 2012-04-20 MED ORDER — DIPHENHYDRAMINE HCL 12.5 MG/5ML PO ELIX: 12.5000 mg | ORAL_SOLUTION | Freq: Four times a day (QID) | ORAL | Status: DC | PRN

## 2012-04-20 MED ORDER — MORPHINE SULFATE (PF) 1 MG/ML IV SOLN: INTRAVENOUS | Status: DC

## 2012-04-20 MED ORDER — ONDANSETRON HCL 4 MG/2ML IJ SOLN
4.0000 mg | Freq: Four times a day (QID) | INTRAMUSCULAR | Status: DC | PRN
Start: 2012-04-20 — End: 2012-04-25
  Administered 2012-04-21 – 2012-04-24 (×2): 4 mg via INTRAVENOUS
  Filled 2012-04-20 (×2): qty 2

## 2012-04-20 MED ORDER — ONDANSETRON HCL 4 MG/2ML IJ SOLN: 4.0000 mg | Freq: Four times a day (QID) | INTRAMUSCULAR | Status: DC | PRN

## 2012-04-20 NOTE — Progress Notes (Signed)
Pt remains very lethargic, dozing off while talking.  Bladder scanned for 159 cc urine,  Pt denies urge to void.

## 2012-04-20 NOTE — Progress Notes (Signed)
OT Cancellation Note  Treatment cancelled today.  Pt just returned to bed from chair.  Groggy, remains on PCA.  Will continue to follow.  Evern Bio 04/20/2012, 2:13 PM

## 2012-04-20 NOTE — Progress Notes (Signed)
Patient ID: Debbie Montoya, female   DOB: 05-22-1936, 76 y.o.   MRN: [id number] BP 112/56  Pulse 113  Temp 101.1 F (38.4 C) (Oral)  Resp 14  Ht 5\' 4"  (1.626 m)  Wt 83.6 kg (184 lb 4.9 oz)  BMI 31.64 kg/m2  SpO2 95% Alert and oriented x 4 Moving lower extremities well Continue with Pt Reports that the left lower extremity feels better.  Wound is clean and dry, no signs of infection

## 2012-04-20 NOTE — Progress Notes (Signed)
UR COMPLETED  

## 2012-04-20 NOTE — Evaluation (Signed)
Physical Therapy Evaluation Patient Details Name: Debbie Montoya MRN: [id number] DOB: [birth year] Today's Date: 04/20/2012 Time: 1478-2956 PT Time Calculation (min): 36 min  PT Assessment / Plan / Recommendation Clinical Impression  76 y.o. female admitted to [facility] for lumbar decompression and fusion.  She presents today very groggy and lethargic likely due to PCA.  She is generally weak in her legs L>R and has increased pain with mobility.      PT Assessment  Patient needs continued PT services    Follow Up Recommendations  Home health PT;Supervision/Assistance - 24 hour    Barriers to Discharge        Equipment Recommendations  None recommended by PT (physician already ordered equipment. )    Recommendations for Other Services     Frequency Min 6X/week    Precautions / Restrictions Precautions Precautions: Back Precaution Comments: reviewed "BAT" and log roll Required Braces or Orthoses: Spinal Brace Spinal Brace: Other (comment) ("brace when OOB") Spinal Brace Comments: order for brace when OOB, no brace in room, RN called MD who stated it was ok to mobilize without brace.     Pertinent Vitals/Pain 7/10 incisional pain, HR increased to 122 with mobility, has PCA      Mobility  Bed Mobility Bed Mobility: Rolling Right;Right Sidelying to Sit;Sitting - Scoot to Edge of Bed Rolling Right: 3: Mod assist Right Sidelying to Sit: 3: Mod assist;With rails;HOB flat Sitting - Scoot to Edge of Bed: 4: Min assist;With rail Details for Bed Mobility Assistance: mod assist to support trunk during transitions, verbal cues for correct technique and hand placement.   Transfers Transfers: Sit to Stand;Stand to Sit Sit to Stand: 3: Mod assist;From bed;With upper extremity assist;From elevated surface Stand to Sit: 3: Mod assist;To chair/3-in-1;With armrests;With upper extremity assist Details for Transfer Assistance: mod assist to stand and sit to support trunk over weak and shakey knees.   Verbal cues for safe hand placement and to keep eyes open Ambulation/Gait Ambulation/Gait Assistance: 3: Mod assist Ambulation Distance (Feet): 25 Feet Assistive device: Rolling walker Ambulation/Gait Assistance Details: mod assist as pt fatigued towards the end of the walk to support trunk for balance.  Left leg showed signs of buckling and decreased foot clearance when turning towards the recliner chair with the RW.   Gait Pattern: Step-through pattern;Trunk flexed Gait velocity: less than 1.8 ft/sec indicating a risk for recurrent falls.   General Gait Details: Needed cueing to stay inside of the RW        PT Diagnosis: Difficulty walking;Abnormality of gait;Generalized weakness;Acute pain  PT Problem List: Decreased strength;Decreased activity tolerance;Decreased balance;Decreased mobility;Decreased knowledge of use of DME;Decreased knowledge of precautions;Pain PT Treatment Interventions: Gait training;Stair training;Functional mobility training;Therapeutic activities;Therapeutic exercise;Neuromuscular re-education;Patient/family education   PT Goals Acute Rehab PT Goals PT Goal Formulation: With patient/family Time For Goal Achievement: 04/27/12 Potential to Achieve Goals: Good Pt will Roll Supine to Right Side: with modified independence PT Goal: Rolling Supine to Right Side - Progress: Goal set today Pt will Roll Supine to Left Side: with modified independence PT Goal: Rolling Supine to Left Side - Progress: Goal set today Pt will go Supine/Side to Sit: with modified independence;with HOB 0 degrees PT Goal: Supine/Side to Sit - Progress: Goal set today Pt will go Sit to Stand: with supervision;with upper extremity assist PT Goal: Sit to Stand - Progress: Goal set today Pt will go Stand to Sit: with supervision;with upper extremity assist PT Goal: Stand to Sit - Progress: Goal set today Pt  will Transfer Bed to Chair/Chair to Bed: with supervision PT Transfer Goal: Bed to  Chair/Chair to Bed - Progress: Goal set today Pt will Ambulate: 51 - 150 feet;with supervision;with rolling walker PT Goal: Ambulate - Progress: Goal set today Pt will Go Up / Down Stairs: 3-5 stairs;with min assist;with rail(s) PT Goal: Up/Down Stairs - Progress: Goal set today Additional Goals Additional Goal #1: Pt will report 3/3 back precautions and demonstrate compliance with them  100% of treatment session.   PT Goal: Additional Goal #1 - Progress: Goal set today  Visit Information  Last PT Received On: 04/20/12 Assistance Needed: +1    Subjective Data  Subjective: Pt report she has not seen a brace.  RN called Dr. Mikal Plane who says it is ok to get her up without the brace.  Patient Stated Goal: Go home with family, decrease left leg and back pain.     Prior Functioning  Home Living Lives With: Spouse Available Help at Discharge: Family;Available 24 hours/day Type of Home: House Home Access: Stairs to enter [employer] of Steps: 6 Entrance Stairs-Rails: Right;Left Home Layout: One level Bathroom Shower/Tub: Tub/shower unit;Walk-in shower;Curtain Bathroom Toilet: Handicapped height Bathroom Accessibility: Yes How Accessible: Accessible via walker Home Adaptive Equipment: Walker - four wheeled;Straight cane;Hand-held shower hose;Grab bars in shower;Grab bars around toilet;Wheelchair - Architectural technologist without back (transport chair, is borrowing rollator from a friend.  ) Additional Comments: used cane to walk PTA outside of the house only.   Prior Function Level of Independence: Needs assistance Needs Assistance: Light Housekeeping;Meal Prep (some meal prep help over the last couple of weeks) Light Housekeeping: Total Able to Take Stairs?: Yes Driving: Yes Vocation: Retired Training and development officer) Dominant Hand: Right       Extremity/Trunk Assessment Right Lower Extremity Assessment RLE ROM/Strength/Tone: Deficits RLE ROM/Strength/Tone Deficits: grossly 3/5 per  fucntional assessment.   RLE Sensation: WFL - Light Touch Left Lower Extremity Assessment LLE ROM/Strength/Tone: Deficits LLE ROM/Strength/Tone Deficits: grossly 3-/5 per functional assessment.   LLE Sensation: WFL - Light Touch   Balance Balance Balance Assessed: Yes Static Sitting Balance Static Sitting - Balance Support: Bilateral upper extremity supported;Feet supported Static Sitting - Level of Assistance: 5: Stand by assistance Static Sitting - Comment/# of Minutes: 3-5 mins EOB to let her sensation of lightheadedness settle.    End of Session PT - End of Session Activity Tolerance: Patient limited by fatigue;Patient limited by pain Patient left: in chair;with call bell/phone within reach;with family/visitor present (husband) Nurse Communication: Other (comment) (RN in room helping with lines)  GP     Lurena Joiner B. , PT, DPT [phone]   04/20/2012, 12:02 PM

## 2012-04-20 NOTE — Clinical Social Work Note (Signed)
Clinical Social Work  CSW received consult for SNF. CSW reviewed chart and discussed pt with RN during progression. PT is recommending home health PT with 24 supervision. Per PT, family is available. RNCM is aware and following. CSW is signing off as no further needs identified.   Dede Query, MSW, Theresia Majors [phone]

## 2012-04-21 DIAGNOSIS — M47816 Spondylosis without myelopathy or radiculopathy, lumbar region: Secondary | ICD-10-CM | POA: Diagnosis present

## 2012-04-21 MED ORDER — OXYCODONE-ACETAMINOPHEN 5-325 MG PO TABS
1.0000 | ORAL_TABLET | ORAL | Status: DC | PRN
  Administered 2012-04-25: 2 via ORAL
  Administered 2012-04-25: 1 via ORAL
  Administered 2012-04-25: 2 via ORAL
  Filled 2012-04-21 (×2): qty 2

## 2012-04-21 MED ORDER — MORPHINE SULFATE 2 MG/ML IJ SOLN: 2.0000 mg | INTRAMUSCULAR | Status: DC | PRN

## 2012-04-21 MED ORDER — HYDROMORPHONE HCL 2 MG PO TABS
2.0000 mg | ORAL_TABLET | Freq: Four times a day (QID) | ORAL | Status: DC | PRN
  Administered 2012-04-22 – 2012-04-24 (×9): 2 mg via ORAL
  Filled 2012-04-21 (×9): qty 1

## 2012-04-21 NOTE — Progress Notes (Signed)
Occupational Therapy Evaluation Patient Details Name: Debbie Montoya MRN: [id number] DOB: [birth year] Today's Date: 04/21/2012 Time: 1478-2956 OT Time Calculation (min): 38 min  OT Assessment / Plan / Recommendation Clinical Impression  76 yo s/p lumbar fusion. Pt will benefit from skilled OT services to max independence with ADL and functinal mobility for ADL to facilitate D/C home with husband due to below deficits. Pt making slow progress at this time due to nausea and low BP. Have discussed with nsg. Order for pt to have spinal brace OOB but no brace in room and pt was not fitted with brace prior to surgery. PLEASE CLARIFY.    OT Assessment  Patient needs continued OT Services    Follow Up Recommendations  Home health OT    Barriers to Discharge None    Equipment Recommendations  3 in 1 bedside comode    Recommendations for Other Services    Frequency  Min 3X/week    Precautions / Restrictions Precautions Precautions: Back Precaution Comments: Pt able to verbalize 2/3 precautions.  Reviewed precautions with pt and husband. Required Braces or Orthoses: Spinal Brace Spinal Brace: Other (comment) Spinal Brace Comments: Brace still not in room.  Dr Franky Macho said ok to get mobilize without brace on 9/19.   Pertinent Vitals/Pain C/o nausea. BP 124/44. nsg made aware    ADL  Grooming: Simulated;Minimal assistance Where Assessed - Grooming: Supported sitting Upper Body Bathing: Simulated;Minimal assistance Where Assessed - Upper Body Bathing: Supported sitting Lower Body Bathing: Simulated;Maximal assistance Where Assessed - Lower Body Bathing: Supported sit to stand Upper Body Dressing: Simulated;Minimal assistance Where Assessed - Upper Body Dressing: Supported sitting Lower Body Dressing: Simulated;Maximal assistance Where Assessed - Lower Body Dressing: Supported sit to Pharmacist, hospital: Mining engineer Method: Sit to stand;Stand  pivot Acupuncturist: Other (comment) (bed - chair) Toileting - Clothing Manipulation and Hygiene: Simulated;Moderate assistance Where Assessed - Toileting Clothing Manipulation and Hygiene: Standing Tub/Shower Transfer: Simulated;Minimal assistance Tub/Shower Transfer Method: Ambulating Equipment Used: Gait belt;Reacher;Sock aid Transfers/Ambulation Related to ADLs: Min A ADL Comments: No knowledge of back precautions and ADL with compensatory techniques /AE    OT Diagnosis: Generalized weakness;Acute pain  OT Problem List: Decreased strength;Decreased range of motion;Decreased activity tolerance;Decreased safety awareness;Decreased knowledge of use of DME or AE;Decreased knowledge of precautions;Cardiopulmonary status limiting activity;Pain OT Treatment Interventions: Self-care/ADL training;Energy conservation;DME and/or AE instruction;Therapeutic activities;Patient/family education   OT Goals Acute Rehab OT Goals OT Goal Formulation: With patient Time For Goal Achievement: 04/28/12 Potential to Achieve Goals: Good ADL Goals Pt Will Perform Lower Body Bathing: with supervision;with caregiver independent in assisting;Sit to stand from chair;Unsupported;with adaptive equipment;with cueing (comment type and amount) ADL Goal: Lower Body Bathing - Progress: Goal set today Pt Will Perform Lower Body Dressing: with supervision;with caregiver independent in assisting;Unsupported;with adaptive equipment;with cueing (comment type and amount);Sit to stand from chair ADL Goal: Lower Body Dressing - Progress: Goal set today Pt Will Transfer to Toilet: with supervision;with DME;Ambulation;3-in-1;Maintaining back safety precautions ADL Goal: Toilet Transfer - Progress: Goal set today Pt Will Perform Toileting - Clothing Manipulation: with supervision;with caregiver independent in assisting;Standing;with cueing (comment type and amount);with adaptive equipment ADL Goal: Toileting - Clothing  Manipulation - Progress: Goal set today Pt Will Perform Toileting - Hygiene: with supervision;with caregiver independent in assisting;Standing at 3-in-1/toilet;with adaptive equipment;with cueing (comment type and amount) ADL Goal: Toileting - Hygiene - Progress: Goal set today Pt Will Perform Tub/Shower Transfer: with supervision;Ambulation;with DME;Maintaining back safety precautions ADL Goal: Tub/Shower Transfer -  Progress: Goal set today Additional ADL Goal #1: Pt will state 3/3 back precautions independently. ADL Goal: Additional Goal #1 - Progress: Goal set today  Visit Information  Last OT Received On: 04/21/12 Assistance Needed: +1    Subjective Data      Prior Functioning  Vision/Perception  Home Living Lives With: Spouse Available Help at Discharge: Family;Available 24 hours/day Type of Home: House Home Access: Stairs to enter [employer] of Steps: 6 Entrance Stairs-Rails: Right;Left Home Layout: One level Bathroom Shower/Tub: Tub/shower unit;Walk-in shower;Curtain Bathroom Toilet: Handicapped height Bathroom Accessibility: Yes How Accessible: Accessible via walker Home Adaptive Equipment: Walker - four wheeled;Straight cane;Grab bars in shower;Grab bars around toilet;Wheelchair - manual;Shower chair without back Additional Comments: used cane to walk PTA outside of the house only.   Prior Function Level of Independence: Needs assistance Needs Assistance: Light Housekeeping;Meal Prep Light Housekeeping: Total Able to Take Stairs?: Yes Driving: Yes Vocation: Retired Musician: No difficulties Dominant Hand: Right      Cognition  Overall Cognitive Status: Appears within functional limits for tasks assessed/performed Arousal/Alertness: Awake/alert Orientation Level: Appears intact for tasks assessed Behavior During Session: [facility] for tasks performed    Extremity/Trunk Assessment Right Upper Extremity Assessment RUE  ROM/Strength/Tone: Within functional levels Left Upper Extremity Assessment LUE ROM/Strength/Tone: Within functional levels   Mobility    Bed Mobility Bed Mobility: Rolling Left;Left Sidelying to Sit;Supine to Sit;Sitting - Scoot to [place] of Bed Rolling Right: 3: Mod assist Rolling Left: 5: Supervision Right Sidelying to Sit: 3: Mod assist;With rails Left Sidelying to Sit: 3: Mod assist;With rails;HOB flat Supine to Sit: 3: Mod assist Sitting - Scoot to Edge of Bed: 5: Supervision Transfers Transfers: Sit to Stand;Stand to Sit Sit to Stand: 4: Min assist;With upper extremity assist;From bed Stand to Sit: 4: Min assist;With upper extremity assist;To chair/3-in-1 Details for Transfer Assistance: cues for hand placement and back precautions       Exercise     Balance  Min A   End of Session OT - End of Session Equipment Utilized During Treatment: Gait belt Activity Tolerance: Patient limited by fatigue Patient left: in chair;with call bell/phone within reach;with family/visitor present Nurse Communication: Precautions;Other (comment) (back brace, BP)  GO     ,HILLARY 04/21/2012, 6:15 PM [facility], OTR/L  [phone] 04/21/2012

## 2012-04-21 NOTE — Progress Notes (Signed)
Patient hasn't voided since foley was removed. Bladder scan done; the result was 467 ml. Assisted pt to the bedside commode but pt haven't voided. In and Out cath done using sterile technique and was able to drain 600 ml of clear yellow urine. Will continue to monitor pt.

## 2012-04-21 NOTE — Progress Notes (Signed)
Patient ID: Debbie Montoya, female   DOB: 01/13/1936, 76 y.o.   MRN: [id number] BP 111/43  Pulse 104  Temp 97.4 F (36.3 C) (Axillary)  Resp 16  Ht 5\' 4"  (1.626 m)  Wt 83.6 kg (184 lb 4.9 oz)  BMI 31.64 kg/m2  SpO2 98% Alert and oriented x 4, speech is clear and fluent Moving lower extremities well.  Wound is clean, and dry, no signs of infection.  Dc pca, continue with pt

## 2012-04-21 NOTE — Care Management Note (Signed)
    Page 1 of 1   04/25/2012     4:36:34 PM   CARE MANAGEMENT NOTE 04/25/2012  Patient:  Debbie Montoya, Debbie Montoya   Account Number:  [id number]  Date Initiated:  04/21/2012  Documentation initiated by:  Onnie Boer  Subjective/Objective Assessment:   PT WAS ADMITTED FOR BACK SURGERY     Action/Plan:   PROGRESSION OF CARE AND DISCHARGE PLANNING   Anticipated DC Date:  04/23/2012   Anticipated DC Plan:  HOME W HOME HEALTH SERVICES      DC Planning Services  CM consult      Choice offered to / List presented to:  C-1 Patient        HH arranged  HH-2 PT      [facility] agency  [facility]   Status of service:  Completed, signed off Medicare Important Message given?   (If response is "NO", the following Medicare IM given date fields will be blank) Date Medicare IM given:   Date Additional Medicare IM given:    Discharge Disposition:  HOME W HOME HEALTH SERVICES  Per UR Regulation:  Reviewed for med. necessity/level of care/duration of stay  If discussed at Long Length of Stay Meetings, dates discussed:    Comments:  04/21/12 Onnie Boer, RN, BSN [phone] PT WAS ADMITTED FOR A FUSION.  PTA PT WAS AT HOME WITH SELF CARE.  PT WILL RETURN HOME WITH HH PT FROM [facility].  PT STATES THAT SHE HAS A RW AND THAT SHE DOESNT NEED A 3N1.  WILL F/U.

## 2012-04-21 NOTE — Progress Notes (Signed)
Physical Therapy Treatment Patient Details Name: Debbie Montoya MRN: [id number] DOB: Dec 30, 1935 Today's Date: 04/21/2012 Time: 4098-1191 PT Time Calculation (min): 28 min  PT Assessment / Plan / Recommendation Comments on Treatment Session  Pt continues to be somewhat lethargic but arouses for treatment.  Moving fairly well considering lethargy.      Follow Up Recommendations  Home health PT;Supervision/Assistance - 24 hour    Barriers to Discharge        Equipment Recommendations  None recommended by PT    Recommendations for Other Services    Frequency Min 6X/week   Plan Discharge plan remains appropriate;Frequency remains appropriate    Precautions / Restrictions Precautions Precautions: Back Precaution Comments: Pt able to verbalize 2/3 precautions.  Reviewed precautions with pt and husband. Required Braces or Orthoses: Spinal Brace (Brace still not present) Spinal Brace: Other (comment) (brace when OOB) Spinal Brace Comments: Brace still not in room.  Dr Franky Macho said ok to get mobilize without brace on 9/19.   Pertinent Vitals/Pain 6/10-using PCA    Mobility  Bed Mobility Bed Mobility: Left Sidelying to Sit;Sit to Sidelying Left Left Sidelying to Sit: 4: Min assist;With rails;HOB elevated Sitting - Scoot to Edge of Bed: 4: Min assist;With rail Sit to Sidelying Left: 4: Min assist;HOB flat Details for Bed Mobility Assistance: Pt needs cues for technique and to follow back precautions.  Assist with LE's back to bed. Transfers Transfers: Sit to Stand;Stand to Sit Sit to Stand: 4: Min assist;With upper extremity assist;From bed Stand to Sit: 4: Min guard;With upper extremity assist;To bed Details for Transfer Assistance: cues for hand placement and min assist to balance for sit-stand. Ambulation/Gait Ambulation/Gait Assistance: 4: Min assist Ambulation Distance (Feet): 70 Feet Assistive device: Rolling walker Ambulation/Gait Assistance Details: Pt's gait slow and  steady.  Cues for RW use at times to avoid obstacles.  No buckling noted today. Gait Pattern: Step-through pattern;Antalgic Gait velocity: decreased General Gait Details: Needed cueing to stay inside of the RW Stairs: No         PT Goals Acute Rehab PT Goals PT Goal: Supine/Side to Sit - Progress: Progressing toward goal PT Goal: Sit to Stand - Progress: Progressing toward goal PT Goal: Stand to Sit - Progress: Progressing toward goal PT Goal: Ambulate - Progress: Progressing toward goal Additional Goals PT Goal: Additional Goal #1 - Progress: Progressing toward goal  Visit Information  Last PT Received On: 04/21/12 Assistance Needed: +1    Subjective Data  Subjective: Pt reported nausea earlier this am and refused PT but now agreeable.   Cognition  Overall Cognitive Status: Appears within functional limits for tasks assessed/performed Arousal/Alertness: Lethargic Orientation Level: Appears intact for tasks assessed Behavior During Session: [facility] for tasks performed    Balance  Balance Balance Assessed: Yes Static Sitting Balance Static Sitting - Balance Support: Bilateral upper extremity supported;Feet supported Static Sitting - Level of Assistance: 5: Stand by assistance Static Sitting - Comment/# of Minutes: 3  End of Session PT - End of Session Equipment Utilized During Treatment: Gait belt Activity Tolerance: Patient limited by fatigue;Patient limited by pain Patient left: in bed;with call bell/phone within reach;with nursing in room;with family/visitor present Nurse Communication: Other (comment);Mobility status (IV leaking and RN in room to assess)    Newell Coral 04/21/2012, 11:28 AM  Newell Coral, PTA Acute Rehab [phone] (office)

## 2012-04-22 NOTE — Progress Notes (Signed)
Physical Therapy Treatment Patient Details Name: Debbie Montoya MRN: [id number] DOB: [birth year] Today's Date: 04/22/2012 Time: 9147-8295 PT Time Calculation (min): 23 min  PT Assessment / Plan / Recommendation Comments on Treatment Session  Pt. sluggish in some of her thinking, having a little difficulty naming familiar things.  Mobility is improving with PT.    Follow Up Recommendations  Home health PT;Supervision/Assistance - 24 hour    Barriers to Discharge        Equipment Recommendations  None recommended by PT    Recommendations for Other Services    Frequency Min 6X/week   Plan Discharge plan remains appropriate;Frequency remains appropriate    Precautions / Restrictions Precautions Precautions: Back Precaution Comments: Pt able to verbalize 3/3 precautions; however, still requires assist to generalize Required Braces or Orthoses: Spinal Brace Spinal Brace: Other (comment) Spinal Brace Comments: Brace still not in room.  Dr Franky Macho said ok to get mobilize without brace on 9/19.   Pertinent Vitals/Pain Pain in back and right leg, not rated, not requesting pain med.  Pt. Positioned for comfort.    Mobility  Bed Mobility Rolling Right: 5: Supervision Right Sidelying to Sit: 4: Min assist;With rails;HOB flat Supine to Sit: 4: Min assist Sitting - Scoot to Edge of Bed: 5: Supervision Details for Bed Mobility Assistance: Pt needs cues for technique and to follow back precautions.  Assist with LE's back to bed. Transfers Transfers: Sit to Stand;Stand to Sit Sit to Stand: 4: Min assist;With upper extremity assist;From bed Stand to Sit: 4: Min assist;With upper extremity assist;To chair/3-in-1 Details for Transfer Assistance: cues for hand placement and back precautions Ambulation/Gait Ambulation/Gait Assistance: 4: Min assist Ambulation Distance (Feet): 125 Feet Assistive device: Rolling walker Ambulation/Gait Assistance Details: Slow steady pace; needed 2 short standing  rest breaks befor return to toom Gait Pattern: Step-through pattern Gait velocity: decreased Stairs: No    Exercises     PT Diagnosis:    PT Problem List:   PT Treatment Interventions:     PT Goals Acute Rehab PT Goals PT Goal: Rolling Supine to Right Side - Progress: Progressing toward goal PT Goal: Supine/Side to Sit - Progress: Progressing toward goal PT Goal: Sit to Stand - Progress: Progressing toward goal PT Goal: Stand to Sit - Progress: Progressing toward goal PT Goal: Ambulate - Progress: Progressing toward goal Additional Goals PT Goal: Additional Goal #1 - Progress: Progressing toward goal  Visit Information  Last PT Received On: 04/22/12 Assistance Needed: +1    Subjective Data  Subjective: Pt. says the nurse is going to help her get into the shower today   Cognition  Overall Cognitive Status: Appears within functional limits for tasks assessed/performed Arousal/Alertness: Awake/alert Orientation Level: Appears intact for tasks assessed Behavior During Session: [facility] for tasks performed    Balance     End of Session PT - End of Session Equipment Utilized During Treatment: Gait belt Activity Tolerance: Patient limited by fatigue;Patient limited by pain Patient left: in chair;with call bell/phone within reach Nurse Communication: Mobility status   GP     Ferman Hamming 04/22/2012, 3:46 PM Weldon Picking PT Acute Rehab Services [phone] [place] [phone]

## 2012-04-22 NOTE — Progress Notes (Signed)
Occupational Therapy Treatment Patient Details Name: Debbie Montoya MRN: [id number] DOB: Nov 06, 1935 Today's Date: 04/22/2012 Time: 4098-1191 OT Time Calculation (min): 20 min  OT Assessment / Plan / Recommendation Comments on Treatment Session Session focused on AE. Next session to focus on shower transfer and possible use of AE     Follow Up Recommendations  Home health OT    Barriers to Discharge       Equipment Recommendations  None recommended by OT (has access to 3n1)    Recommendations for Other Services    Frequency     Plan Discharge plan remains appropriate    Precautions / Restrictions Precautions Precautions: Back Precaution Comments: Pt able to verbalize 3/3 precautions; however, still requires assist to generalize Required Braces or Orthoses: Spinal Brace   Pertinent Vitals/Pain Pt with no c/o pain at this time    ADL  ADL Comments: Educated pt on use of AE for LB dsg and bathing. Pt verbalized understanding but declined practice at this time. Pt's friend in room and states she has 3n1 which pt can have. Also, educated pt on use of 3n1 over toilet, at sink (if needed) and in shower)    OT Diagnosis:    OT Problem List:   OT Treatment Interventions:     OT Goals ADL Goals ADL Goal: Lower Body Bathing - Progress: Progressing toward goals ADL Goal: Lower Body Dressing - Progress: Progressing toward goals ADL Goal: Toileting - Hygiene - Progress: Progressing toward goals ADL Goal: Additional Goal #1 - Progress: Progressing toward goals  Visit Information  Last OT Received On: 04/22/12 Assistance Needed: +1    Subjective Data  Subjective: My lunch made me naseous Patient Stated Goal: Return home   Prior Functioning       Cognition  Overall Cognitive Status: Appears within functional limits for tasks assessed/performed Arousal/Alertness: Awake/alert Orientation Level: Appears intact for tasks assessed Behavior During Session: [facility] for tasks performed    Mobility  Shoulder Instructions         Exercises      Balance     End of Session OT - End of Session Activity Tolerance: Patient tolerated treatment well Patient left: in bed;with call bell/phone within reach;with bed alarm set;with family/visitor present  GO     , 04/22/2012, 1:54 PM

## 2012-04-22 NOTE — Progress Notes (Signed)
No new issues or complaints.  She is afebrile. Her vitals are stable. Urine output is adequate. Motor and sensory exam intact. Wound clean dry and intact.  Progressing well. Does not feel rate for discharge today but is hopeful for tomorrow. Continue efforts at mobilization. No new orders.

## 2012-04-23 MED ORDER — MAGNESIUM HYDROXIDE 400 MG/5ML PO SUSP
30.0000 mL | Freq: Every day | ORAL | Status: DC | PRN
  Administered 2012-04-23 – 2012-04-24 (×2): 30 mL via ORAL
  Filled 2012-04-23 (×2): qty 30

## 2012-04-23 MED ORDER — VALACYCLOVIR HCL 500 MG PO TABS
500.0000 mg | ORAL_TABLET | Freq: Two times a day (BID) | ORAL | Status: DC
  Administered 2012-04-23 – 2012-04-25 (×5): 500 mg via ORAL
  Filled 2012-04-23 (×6): qty 1

## 2012-04-23 NOTE — Progress Notes (Signed)
Physical Therapy Treatment Patient Details Name: Debbie Montoya MRN: [id number] DOB: 1936-07-22 Today's Date: 04/23/2012 Time: 4098-1191 PT Time Calculation (min): 15 min  PT Assessment / Plan / Recommendation Comments on Treatment Session  Pt reports pain in LE's L>R limits her mobility however, pt able to increase ambulation distance today.   Pt will need to perform stairs before d/cing home.      Follow Up Recommendations  Home health PT;Supervision/Assistance - 24 hour    Barriers to Discharge        Equipment Recommendations  None recommended by PT    Recommendations for Other Services    Frequency Min 6X/week   Plan Discharge plan remains appropriate;Frequency remains appropriate    Precautions / Restrictions Precautions Precautions: Back Precaution Comments: Pt able to verbalize 3/3 precautions; however, still requires assist to generalize Required Braces or Orthoses: Spinal Brace Spinal Brace Comments: Brace still not in room.  Dr Franky Macho said ok to get mobilize without brace on 9/19.    Pertinent Vitals/Pain C/o LE pain L>R.  Not rated.      Mobility  Bed Mobility Bed Mobility: Right Sidelying to Sit Right Sidelying to Sit: 4: Min assist;HOB flat Details for Bed Mobility Assistance: Pt in Rt sidelying position upon arrival.  Minor min assist at end of transition of sitting upright.  Cuesu for technique.   Transfers Transfers: Sit to Stand;Stand to Sit Sit to Stand: With upper extremity assist;From bed;4: Min assist Stand to Sit: 4: Min guard;With upper extremity assist;With armrests;To chair/3-in-1 Details for Transfer Assistance: cues for hand placement & reinforcement of back precautions.  Minor assist to achieve standing.   Ambulation/Gait Ambulation/Gait Assistance: 4: Min guard Ambulation Distance (Feet): 140 Feet Assistive device: Rolling walker Ambulation/Gait Assistance Details: Cont's with slow steady pace.  Heavy reliance of UE's on RW due to LE pain  L>R.   Gait Pattern: Step-through pattern;Decreased stance time - left Gait velocity: decreased Stairs: No    Exercises       PT Goals Acute Rehab PT Goals Time For Goal Achievement: 04/27/12 Potential to Achieve Goals: Good PT Goal: Supine/Side to Sit - Progress: Progressing toward goal PT Goal: Sit to Stand - Progress: Progressing toward goal PT Goal: Stand to Sit - Progress: Progressing toward goal PT Goal: Ambulate - Progress: Progressing toward goal  Visit Information  Last PT Received On: 04/23/12 Assistance Needed: +1    Subjective Data  Subjective: "I feel sick to my stomach today" Patient Stated Goal: Go home with family, decrease left leg and back pain.     Cognition  Overall Cognitive Status: Appears within functional limits for tasks assessed/performed Arousal/Alertness: Awake/alert Orientation Level: Appears intact for tasks assessed Behavior During Session: [facility] for tasks performed    Balance     End of Session PT - End of Session Equipment Utilized During Treatment: Gait belt Activity Tolerance: Patient tolerated treatment well;Patient limited by pain Patient left: in chair;with call bell/phone within reach Nurse Communication: Mobility status    Verdell Face, [place] [phone] 04/23/2012

## 2012-04-23 NOTE — Progress Notes (Addendum)
Subjective: Patient reports Patient notes constipation and significant problems with back pain mobilizing very poorly also has fever blisters  Objective: Vital signs in last 24 hours: Temp:  [97.8 F (36.6 C)-98.2 F (36.8 C)] 97.8 F (36.6 C) (09/22 1055) Pulse Rate:  [78-92] 78  (09/22 1055) Resp:  [18-20] 20  (09/22 1055) BP: (116-132)/(54-74) 116/62 mmHg (09/22 1055) SpO2:  [95 %-100 %] 95 % (09/22 1055)  Intake/Output from previous day: 09/21 0701 - 09/22 0700 In: 720 [P.O.:720] Out: 350 [Urine:350] Intake/Output this shift:    Incision is clean and dry motor function appears good and lower extremities abdomen somewhat distended  Lab Results: No results found for this basename: WBC:2,HGB:2,HCT:2,PLT:2 in the last 72 hours BMET No results found for this basename: NA:2,K:2,CL:2,CO2:2,GLUCOSE:2,BUN:2,CREATININE:2,CALCIUM:2 in the last 72 hours  Studies/Results: No results found.  Assessment/Plan: Stable postop not quite ready for discharge secondary to mobilization, constipation  LOS: 4 days  Milk of magnesia for constipation encouraged mobilization Valtrex for fever blisters   , J 04/23/2012, 11:09 AM

## 2012-04-24 MED ORDER — MAGNESIUM CITRATE PO SOLN
1.0000 | Freq: Once | ORAL | Status: AC
  Administered 2012-04-24: 1 via ORAL
  Filled 2012-04-24: qty 296

## 2012-04-24 NOTE — Progress Notes (Signed)
Patient had a large bowel movement, states " I feel better"

## 2012-04-24 NOTE — Progress Notes (Signed)
Patient ID: Debbie Montoya, female   DOB: 1936-02-26, 77 y.o.   MRN: [id number] BP 136/63  Pulse 87  Temp 97.9 F (36.6 C) (Oral)  Resp 18  Ht 5\' 4"  (1.626 m)  Wt 83.6 kg (184 lb 4.9 oz)  BMI 31.64 kg/m2  SpO2 92% Alert and oriented x 4  Moving slowly though without difficulty Wound is clean and dry Continue PT

## 2012-04-24 NOTE — Progress Notes (Signed)
Occupational Therapy Treatment Patient Details Name: Debbie Montoya MRN: [id number] DOB: 1936-05-21 Today's Date: 04/24/2012 Time: 8295-6213 OT Time Calculation (min): 12 min   Equipment Recommendations  None recommended by PT       Frequency     Plan Discharge plan remains appropriate    Precautions / Restrictions Precautions Precautions: Back Precaution Comments: able to verbalize 3/3, good demonstration of knowledge today Spinal Brace Comments: OK to mobilize without brace, patient reports she has never had one and per earlier notes Dr. Franky Macho OK for mobiliy without       ADL  ADL Comments: OT session focused on back precautions with ADL activity. Pt able to verbalize but did not practice due to pain.  Husnad reports pt wants a reacher, but pt does want a sock aid . Instructed where to obtain      OT Goals ADL Goals ADL Goal: Additional Goal #1 - Progress: Progressing toward goals  Visit Information  Assistance Needed: +1          Cognition  Overall Cognitive Status: Appears within functional limits for tasks assessed/performed Arousal/Alertness: Awake/alert Orientation Level: Appears intact for tasks assessed    Mobility  Shoulder Instructions Bed Mobility Rolling Left: 5: Supervision;With rail Left Sidelying to Sit: 4: Min guard Sitting - Scoot to Edge of Bed: 5: Supervision Details for Bed Mobility Assistance: verbal cues for technique with proper body mechanics Transfers Sit to Stand: 4: Min guard;With upper extremity assist;With armrests;From toilet Stand to Sit: 4: Min guard;With upper extremity assist;To bed;To toilet Details for Transfer Assistance: cues for proper hand placement, pt with very slow ascent 2/2 pain needing both arms on RW to power up to full standing; cues for controlled descent to chair             End of Session OT - End of Session Activity Tolerance: Patient limited by pain Patient left: in bed       , Metro Kung 04/24/2012, 3:36 PM

## 2012-04-24 NOTE — Progress Notes (Signed)
Physical Therapy Treatment Patient Details Name: Debbie Montoya MRN: [id number] DOB: January 19, 1936 Today's Date: 04/24/2012 Time: 4098-1191 PT Time Calculation (min): 25 min  PT Assessment / Plan / Recommendation Comments on Treatment Session  Pain in back most evident during transfers. Mostly complaining of abdominal discomfort/bloating and has not had a bowel movement yet. Encouraged pt to ambulate more throughout the day with nursing staff to encourage movement of her bowels.     Follow Up Recommendations  Home health PT;Supervision for mobility/OOB    Barriers to Discharge        Equipment Recommendations  None recommended by PT    Recommendations for Other Services    Frequency     Plan Discharge plan remains appropriate;Frequency remains appropriate    Precautions / Restrictions Precautions Precautions: Back Precaution Comments: able to verbalize 3/3, good demonstration of knowledge today Spinal Brace Comments: OK to mobilize without brace, patient reports she has never had one and per earlier notes Dr. Franky Macho OK for [facility] without       Mobility  Bed Mobility Rolling Left: 5: Supervision;With rail Left Sidelying to Sit: 4: Min guard Sitting - Scoot to Edge of Bed: 5: Supervision Details for Bed Mobility Assistance: verbal cues for technique with proper body mechanics Transfers Transfers: Sit to Stand;Stand to Sit Sit to Stand: 4: Min guard;With upper extremity assist;With armrests;From toilet Stand to Sit: 4: Min guard;With upper extremity assist;To bed;To toilet Details for Transfer Assistance: cues for proper hand placement, pt with very slow ascent 2/2 pain needing both arms on RW to power up to full standing; cues for controlled descent to chair Ambulation/Gait Ambulation/Gait Assistance: 4: Min guard Ambulation Distance (Feet): 250 Feet Assistive device: Rolling walker Ambulation/Gait Assistance Details: Attempted to ambulate to stairs however pt with increased  fatigue and general nausea (has not had a bowel movement yet and is drinking very stron laxative today); as pt fatigues she needs closer gaurding for safety; cues for safe technique using RW Gait Pattern: Trunk flexed;Shuffle      PT Goals Acute Rehab PT Goals PT Goal: Rolling Supine to Left Side - Progress: Progressing toward goal PT Goal: Supine/Side to Sit - Progress: Progressing toward goal PT Goal: Sit to Stand - Progress: Progressing toward goal PT Goal: Stand to Sit - Progress: Progressing toward goal PT Transfer Goal: Bed to Chair/Chair to Bed - Progress: Progressing toward goal PT Goal: Ambulate - Progress: Progressing toward goal Additional Goals PT Goal: Additional Goal #1 - Progress: Progressing toward goal  Visit Information  Last PT Received On: 04/24/12 Assistance Needed: +1    Subjective Data  Subjective: I just feel a little nauseous. Im trying to have a bowel movement today.    Cognition  Overall Cognitive Status: Appears within functional limits for tasks assessed/performed Arousal/Alertness: Awake/alert Orientation Level: Appears intact for tasks assessed    Balance     End of Session PT - End of Session Equipment Utilized During Treatment: Gait belt Activity Tolerance: Patient tolerated treatment well;Patient limited by fatigue Patient left: in chair;with call bell/phone within reach   GP     [facility] HELEN 04/24/2012, 1:58 PM

## 2012-04-25 MED ORDER — DIAZEPAM 5 MG PO TABS: 5.0000 mg | ORAL_TABLET | Freq: Four times a day (QID) | ORAL | Status: DC | PRN

## 2012-04-25 MED ORDER — OXYCODONE-ACETAMINOPHEN 5-325 MG PO TABS: 1.0000 | ORAL_TABLET | Freq: Four times a day (QID) | ORAL | Status: DC | PRN

## 2012-04-25 NOTE — Progress Notes (Signed)
Discharge orders received.  Pt stable and pain is controlled.  Discharge and follow up instructions reviewed with patient and family member.  Pt to be discharged home with husband.  , [place] Marie , RN

## 2012-04-25 NOTE — Progress Notes (Signed)
Physical Therapy Treatment Patient Details Name: Debbie Montoya MRN: [id number] DOB: June 21, 1936 Today's Date: 04/25/2012 Time: 8295-6213 PT Time Calculation (min): 20 min  PT Assessment / Plan / Recommendation Comments on Treatment Session  Moving well today. Still concerned about her back precautions but we discussed them at length. Wants to d/c home today.    Follow Up Recommendations    HHPT   Barriers to Discharge        Equipment Recommendations  Other (comment) (pt will need a shower seat, but wants HH to help decide )    Recommendations for Other Services    Frequency     Plan Discharge plan remains appropriate;Frequency remains appropriate    Precautions / Restrictions Precautions Precautions: Back Precaution Booklet Issued: Yes (comment) Precaution Comments: able to verbalize 3/3, good demonstration of knowledge today Spinal Brace Comments: OK to mobilize without brace, patient reports she has never had one and per earlier notes Dr. Franky Macho OK for [facility] without       Mobility  Bed Mobility Bed Mobility: Not assessed Rolling Right: 5: Supervision;With rail Left Sidelying to Sit: With rails;HOB flat Supine to Sit: 5: Supervision;With rails;HOB flat Sitting - Scoot to Edge of Bed: 5: Supervision Sit to Sidelying Left: HOB flat;5: Supervision Details for Bed Mobility Assistance: verbal cues for technique Transfers Sit to Stand: With upper extremity assist;5: Supervision Stand to Sit: With upper extremity assist;5: Supervision Details for Transfer Assistance: again slow because of pain but no LOB noted, good technique with hand placement and RW Ambulation/Gait Ambulation/Gait Assistance: 5: Supervision Ambulation Distance (Feet): 200 Feet Assistive device: Rolling walker Ambulation/Gait Assistance Details: cues for upright posture but good technique Gait velocity: appropriately slowed Stairs: Yes Stairs Assistance: 5: Supervision Stairs Assistance Details  (indicate cue type and reason): good technique, supervision for safety Stair Management Technique: Two rails;Step to pattern;Forwards Number of Stairs: 5     Exercises     PT Diagnosis:    PT Problem List:   PT Treatment Interventions:     PT Goals Acute Rehab PT Goals PT Goal: Supine/Side to Sit - Progress: Progressing toward goal PT Goal: Sit to Stand - Progress: Met PT Goal: Stand to Sit - Progress: Met PT Transfer Goal: Bed to Chair/Chair to Bed - Progress: Progressing toward goal PT Goal: Ambulate - Progress: Met PT Goal: Up/Down Stairs - Progress: Met Additional Goals PT Goal: Additional Goal #1 - Progress: Progressing toward goal  Visit Information  Last PT Received On: 04/25/12 Assistance Needed: +1    Subjective Data  Subjective: Im still feeling a bit nauseous this morning.    Cognition  Overall Cognitive Status: Appears within functional limits for tasks assessed/performed Arousal/Alertness: Awake/alert Orientation Level: Appears intact for tasks assessed Behavior During Session: [facility] for tasks performed    Balance     End of Session PT - End of Session Equipment Utilized During Treatment: Gait belt Activity Tolerance: Patient tolerated treatment well Patient left: in bed;with call bell/phone within reach Nurse Communication: Mobility status   GP     [facility] HELEN 04/25/2012, 1:34 PM

## 2012-04-25 NOTE — Discharge Summary (Signed)
Physician Discharge Summary  Patient ID: Debbie Montoya MRN: [id number] DOB/AGE: 74-May-1937 74 y.o.  Admit date: 04/19/2012 Discharge date: 04/25/2012  Admission Diagnoses:  L4/5 spondylolisthesis,L4/5 spondylosis, l4/5 lumbar stenosis, lumbar radiculopathy  Discharge Diagnoses:   L4/5 spondylolisthesis,L4/5 spondylosis, l4/5 lumbar stenosis, lumbar radiculopathy  Principal Problem:  *Spondylosis of lumbar joint   Discharged Condition: good  Hospital Course: Debbie Montoya was admitted to the hospital and underwent an L4/5 Plif, posterolateral arthodesis, and pedicle screw fixation, and decompression. Post op she did well. At discharge she is ambulating, voiding, and tolerating a regular diet. Her strength was normal. Wound was clean, dry and without signs of infection. She will have home health PT.  Consults: None  Significant Diagnostic Studies: none  Treatments: surgery: :L4/5  POSTERIOR LUMBAR Interbody FUSION 1 LEVEL  Nuvasive Peek cage 12mm on the left  Decompression L4/5 left beyond what is needed for a PlIF  Pedicle screw fixation Nuvasive masplif system L4/5     Discharge Exam: Blood pressure 131/78, pulse 76, temperature 98 F (36.7 C), temperature source Oral, resp. rate 20, height 5\' 4"  (1.626 m), weight 83.6 kg (184 lb 4.9 oz), SpO2 100.00%. General appearance: alert, cooperative and appears stated age Neurologic: Mental status: Alert, oriented, thought content appropriate Cranial nerves: normal Motor: grossly normal Coordination: normal Gait: Antalgic  Disposition: 01-Home or Self Care  Discharge Orders    Future Orders Please Complete By Expires   Ambulatory referral to Home Health      Comments:   Please evaluate Debbie Montoya for admission to [facility].  Disciplines requested: Physical Therapy and Occupational Therapy  Services to provide: Strengthening Exercises and Evaluate  Physician to follow patient's care (the person listed here will be  responsible for signing ongoing orders): Referring Provider  Requested Start of Care Date: Within 2-3 days  Special Instructions:  After discharge       Medication List     As of 04/25/2012  6:19 PM    STOP taking these medications         HYDROcodone-acetaminophen 7.5-325 MG per tablet   Commonly known as: NORCO      TAKE these medications         buPROPion 150 MG 12 hr tablet   Commonly known as: WELLBUTRIN SR   Take 150 mg by mouth 2 (two) times daily.      diazepam 5 MG tablet   Commonly known as: VALIUM   Take 1 tablet (5 mg total) by mouth every 6 (six) hours as needed.      fenofibrate 160 MG tablet   Take 160 mg by mouth daily before breakfast.      FLUoxetine 10 MG capsule   Commonly known as: PROZAC   Take 10 mg by mouth daily before breakfast.      gabapentin 300 MG capsule   Commonly known as: NEURONTIN   Take 600 mg by mouth Twice daily.      LORazepam 0.5 MG tablet   Commonly known as: ATIVAN   Take 0.5 mg by mouth 2 (two) times daily as needed. For anxiety      omeprazole 20 MG capsule   Commonly known as: PRILOSEC   Take 20 mg by mouth daily before breakfast.      oxyCODONE-acetaminophen 5-325 MG per tablet   Commonly known as: PERCOCET/ROXICET   Take 1 tablet by mouth every 6 (six) hours as needed for pain.      pioglitazone-metformin 15-500 MG per tablet  Commonly known as: ACTOPLUS MET   Take 1 tablet by mouth daily before breakfast.      valACYclovir 1000 MG tablet   Commonly known as: VALTREX   Take 1,000 mg by mouth 2 (two) times daily. Patient takes two pills at first sign of fever blister and then two more tablets 12hours later.           Follow-up Information    Follow up with , L, MD. In 4 weeks. (call to make appt)    Contact information:   [address] [place] [address] [phone]          Signed: , L 04/25/2012, 6:19 PM

## 2012-04-25 NOTE — Progress Notes (Signed)
Occupational Therapy Treatment Patient Details Name: Debbie Montoya MRN: [id number] DOB: November 18, 1935 Today's Date: 04/25/2012 Time: 4098-1191 OT Time Calculation (min): 23 min  OT Assessment / Plan / Recommendation          Equipment Recommendations  Other (comment) (pt will need a shower seat, but wants HH to help decide )          Plan Discharge plan remains appropriate    Precautions / Restrictions Precautions Precautions: Back Precaution Comments: able to verbalize 3/3, good demonstration of knowledge today Spinal Brace Comments: OK to mobilize without brace, patient reports she has never had one and per earlier notes Dr. Franky Macho OK for mobiliy without       ADL  Grooming: Performed;Supervision/safety Where Assessed - Grooming: Unsupported standing Lower Body Dressing: Performed;Minimal assistance;Other (comment) (socks) Toilet Transfer: Simulated;Supervision/safety Toilet Transfer Method: Sit to stand;Other (comment) (walking with walker) Toilet Transfer Equipment: Comfort height toilet Toileting - Clothing Manipulation and Hygiene: Simulated;Supervision/safety Where Assessed - Engineer, mining and Hygiene: Standing Tub/Shower Transfer: Simulated;Min guard Tub/Shower Transfer Method: Ambulating Transfers/Ambulation Related to ADLs: Pt able to recall all back precautions      OT Goals ADL Goals ADL Goal: Lower Body Dressing - Progress: Progressing toward goals ADL Goal: Toilet Transfer - Progress: Progressing toward goals ADL Goal: Toileting - Hygiene - Progress: Other (comment) (educated pt on no twisting with hygiene) ADL Goal: Additional Goal #1 - Progress: Met  Visit Information  Last OT Received On: 04/25/12    Subjective Data  Subjective: i am naseous, but i will get up Patient Stated Goal: I had a BM - so i hope i get to go home      Cognition  Overall Cognitive Status: Appears within functional limits for tasks  assessed/performed Arousal/Alertness: Awake/alert Orientation Level: Appears intact for tasks assessed    Mobility  Shoulder Instructions Bed Mobility Rolling Right: 5: Supervision;With rail Left Sidelying to Sit: With rails;HOB flat Supine to Sit: 5: Supervision;With rails;HOB flat Sitting - Scoot to Edge of Bed: 5: Supervision Details for Bed Mobility Assistance: verbal cues for technique with proper body mechanics Transfers Transfers: Sit to Stand;Stand to Sit Sit to Stand: With upper extremity assist;With armrests;5: Supervision;From bed Stand to Sit: With upper extremity assist;To bed;5: Supervision;To chair/3-in-1             End of Session OT - End of Session Activity Tolerance: Patient tolerated treatment well Patient left: in chair       , Metro Kung 04/25/2012, 9:41 AM

## 2012-10-23 ENCOUNTER — Other Ambulatory Visit: Payer: Self-pay | Admitting: Neurosurgery

## 2012-10-23 DIAGNOSIS — M545 Low back pain: Secondary | ICD-10-CM

## 2012-10-26 ENCOUNTER — Ambulatory Visit
Admission: RE | Admit: 2012-10-26 | Discharge: 2012-10-26 | Disposition: A | Discharge status: Home / Self Care | Payer: Medicare Other | Source: Ambulatory Visit | Attending: Neurosurgery | Admitting: Neurosurgery

## 2012-10-26 DIAGNOSIS — M545 Low back pain: Secondary | ICD-10-CM

## 2012-11-01 ENCOUNTER — Other Ambulatory Visit: Payer: Self-pay | Admitting: Neurosurgery

## 2012-11-01 DIAGNOSIS — M545 Low back pain: Secondary | ICD-10-CM

## 2012-11-08 ENCOUNTER — Ambulatory Visit
Admission: RE | Admit: 2012-11-08 | Discharge: 2012-11-08 | Disposition: A | Discharge status: Home / Self Care | Payer: Medicare Other | Source: Ambulatory Visit | Attending: Neurosurgery | Admitting: Neurosurgery

## 2012-11-08 DIAGNOSIS — M545 Low back pain: Secondary | ICD-10-CM

## 2012-11-08 MED ORDER — GADOBENATE DIMEGLUMINE 529 MG/ML IV SOLN
16.0000 mL | Freq: Once | INTRAVENOUS | Status: AC | PRN
  Administered 2012-11-08: 16 mL via INTRAVENOUS

## 2013-03-26 ENCOUNTER — Other Ambulatory Visit: Payer: Self-pay

## 2013-03-26 ENCOUNTER — Other Ambulatory Visit (HOSPITAL_COMMUNITY): Payer: Self-pay | Admitting: Neurosurgery

## 2013-03-26 ENCOUNTER — Other Ambulatory Visit: Payer: Self-pay | Admitting: Neurosurgery

## 2013-03-26 DIAGNOSIS — Z1231 Encounter for screening mammogram for malignant neoplasm of breast: Secondary | ICD-10-CM

## 2013-03-26 DIAGNOSIS — M47817 Spondylosis without myelopathy or radiculopathy, lumbosacral region: Secondary | ICD-10-CM

## 2013-04-03 ENCOUNTER — Encounter (HOSPITAL_COMMUNITY): Payer: Self-pay | Admitting: Pharmacy Technician

## 2013-04-11 ENCOUNTER — Ambulatory Visit (HOSPITAL_COMMUNITY)
Admission: RE | Admit: 2013-04-11 | Discharge: 2013-04-11 | Disposition: A | Discharge status: Home / Self Care | Payer: Medicare Other | Source: Ambulatory Visit | Attending: Neurosurgery | Admitting: Neurosurgery

## 2013-04-11 ENCOUNTER — Ambulatory Visit (HOSPITAL_COMMUNITY): Admission: RE | Admit: 2013-04-11 | Payer: Medicare Other | Source: Ambulatory Visit

## 2013-04-11 DIAGNOSIS — M4716 Other spondylosis with myelopathy, lumbar region: Secondary | ICD-10-CM | POA: Insufficient documentation

## 2013-04-11 DIAGNOSIS — Q762 Congenital spondylolisthesis: Secondary | ICD-10-CM | POA: Insufficient documentation

## 2013-04-11 DIAGNOSIS — Z01812 Encounter for preprocedural laboratory examination: Secondary | ICD-10-CM | POA: Insufficient documentation

## 2013-04-11 DIAGNOSIS — IMO0002 Reserved for concepts with insufficient information to code with codable children: Secondary | ICD-10-CM | POA: Insufficient documentation

## 2013-04-11 DIAGNOSIS — M47817 Spondylosis without myelopathy or radiculopathy, lumbosacral region: Secondary | ICD-10-CM

## 2013-04-11 LAB — GLUCOSE, CAPILLARY: Glucose-Capillary: 86 mg/dL (ref 70–99)

## 2013-04-11 MED ORDER — ONDANSETRON HCL 4 MG/2ML IJ SOLN: 4.0000 mg | Freq: Four times a day (QID) | INTRAMUSCULAR | Status: DC | PRN

## 2013-04-11 MED ORDER — IOHEXOL 180 MG/ML  SOLN
20.0000 mL | Freq: Once | INTRAMUSCULAR | Status: AC | PRN
  Administered 2013-04-11: 0.01 mL via INTRATHECAL

## 2013-04-11 MED ORDER — DIAZEPAM 5 MG PO TABS
10.0000 mg | ORAL_TABLET | Freq: Once | ORAL | Status: AC
  Administered 2013-04-11: 10 mg via ORAL
  Filled 2013-04-11: qty 2

## 2013-04-17 ENCOUNTER — Ambulatory Visit: Payer: Medicare Other

## 2013-04-20 ENCOUNTER — Ambulatory Visit
Admission: RE | Admit: 2013-04-20 | Discharge: 2013-04-20 | Disposition: A | Discharge status: Home / Self Care | Payer: Medicare Other | Source: Ambulatory Visit

## 2013-04-20 DIAGNOSIS — Z1231 Encounter for screening mammogram for malignant neoplasm of breast: Secondary | ICD-10-CM

## 2014-02-04 ENCOUNTER — Encounter: Payer: Self-pay | Admitting: *Deleted

## 2014-02-05 ENCOUNTER — Ambulatory Visit (INDEPENDENT_AMBULATORY_CARE_PROVIDER_SITE_OTHER): Payer: Medicare Other | Admitting: Neurology

## 2014-02-05 ENCOUNTER — Encounter: Payer: Self-pay | Admitting: Neurology

## 2014-02-05 VITALS — BP 120/58 | HR 83 | Resp 14 | Ht 64.0 in | Wt 181.1 lb

## 2014-02-05 DIAGNOSIS — R2681 Unsteadiness on feet: Secondary | ICD-10-CM

## 2014-02-05 DIAGNOSIS — G609 Hereditary and idiopathic neuropathy, unspecified: Secondary | ICD-10-CM

## 2014-02-05 DIAGNOSIS — R55 Syncope and collapse: Secondary | ICD-10-CM

## 2014-02-05 DIAGNOSIS — R269 Unspecified abnormalities of gait and mobility: Secondary | ICD-10-CM

## 2014-02-05 LAB — VITAMIN B12: Vitamin B-12: 426 pg/mL (ref 211–911)

## 2014-02-05 LAB — TSH: TSH: 1.965 u[IU]/mL (ref 0.350–4.500)

## 2014-02-05 NOTE — Progress Notes (Signed)
NEUROLOGY CONSULTATION NOTE  Debbie Montoya MRN: [id number] DOB: 1936-01-03  Referring provider: Dr. Maxwell Caul Primary care provider: Dr. Maxwell Caul  Reason for consult:  Gait instability, falls  HISTORY OF PRESENT ILLNESS: Debbie Montoya is a 78 year old right-handed woman with history of lumbar degenerative disc disease status post discectomy, type 2 diabetes mellitus, dyslipidemia, GERD and depression who presents for gait instability and falls.  In the Fall of 2013, she underwent lumbar surgery.  During PT, she was told that she wasn't picking up her legs enough.  When she walks, she feels clumsy and unsteady.  It is particularly bad when she bends forward.  She denies vertigo, lightheadedness, lower extremity weakness or numbness or pain in the feet.  Her knees don't buckle.  She has chronic back and leg pain since the surgery.  She denies neck pain.  She fell the day before Thanksgiving 2014 and she is not sure why.  She fell again on Christmas Eve, while in the bathroom adjusting her pants.  She simply fell forward, hitting her head.  She is amnestic to the rest of the evening.  She didn't seek medical attention at that time, and she was told later that she probably suffered a concussion.  She had a couple of "little" falls since then.  Since the surgery, she occasionally uses a cane, particularly with prolonged walks.  Frequency of its use fluctuates.  She has history of diabetes mellitus type II, which was been well controlled for several years.  Recent Hgb A1c from June was 5.5.   In June 2014, she had a family reunion at her home.  The next day, she said she couldn't remember the entire previous day.  She could recall snippets of the party but that is all.  She did not hit her head.  She was not aware by anyone that she was acting strange.  That next morning, she drove to [place] to see her daughter.  On the drive, she felt sleepy, so she stopped off at a [employer] and took a nap.  When she  arrived, her daughter told her she was "stumbly".  She was fine the next day.    11/08/12 MRI LUMBAR SPINE W/WO:  interval discectomy and fusion with stable post-surgical changes.  Chronic degeneration at L5-S1 including chronic far lateral protrusion. 10/26/12 CT LUMBAR SPINE WO:  post-operative changes L4-5 with disc bulges at L2-3, L3-4 and L5-S1 without any nerve root compression or stenosis. 08/13/07 MRI LUMBAR SPINE WO:  small disc protrusion and degeneration at L4-5 with mild spinal stenosis and without significant neural compression.  PAST MEDICAL HISTORY: Past Medical History  Diagnosis Date  . Anxiety   . Diabetes mellitus   . GERD (gastroesophageal reflux disease)   . Arthritis     lumbar spondylosis  . Neuromuscular disorder     nerve pain on L leg from back   . Pancreatitis     1980's, pt. reports that she was hospitalized for 3 weeks, [facility]  . Stroke     2012- seen in ER for supposed , everything checked out OK     PAST SURGICAL HISTORY: Past Surgical History  Procedure Laterality Date  . Tonsillectomy    . Appendectomy    . Abdominal hysterectomy    . Cholecystectomy      MEDICATIONS: Current Outpatient Prescriptions on File Prior to Visit  Medication Sig Dispense Refill  . buPROPion (WELLBUTRIN XL) 150 MG 24 hr tablet Take 150 mg by mouth  daily.      . fenofibrate 160 MG tablet Take 160 mg by mouth daily before breakfast.       . gabapentin (NEURONTIN) 300 MG capsule Take 300 mg by mouth Twice daily.       Marland Kitchen HYDROcodone-acetaminophen (NORCO) 7.5-325 MG per tablet Take 1 tablet by mouth every 6 (six) hours as needed for pain.      Marland Kitchen LORazepam (ATIVAN) 1 MG tablet Take 1 mg by mouth every 8 (eight) hours as needed for anxiety.      Marland Kitchen omeprazole (PRILOSEC) 20 MG capsule Take 20 mg by mouth daily before breakfast.       . pioglitazone-metformin (ACTOPLUS MET) 15-500 MG per tablet Take 1 tablet by mouth daily before breakfast.       . valACYclovir (VALTREX) 1000 MG  tablet Take 1,000 mg by mouth 2 (two) times daily as needed (for fever blisters). Patient takes two pills at first sign of fever blister and then two more tablets 12hours later.       No current facility-administered medications on file prior to visit.    ALLERGIES: Allergies  Allergen Reactions  . Tape Other (See Comments)    Tender skin  . Penicillins Swelling and Rash    FAMILY HISTORY: Family History  Problem Relation Age of Onset  . Cancer Father     prostate  . COPD Mother     SOCIAL HISTORY: History   Social History  . Marital Status: Married    Spouse Name: N/A    Number of Children: N/A  . Years of Education: N/A   Occupational History  . Not on file.   Social History Main Topics  . Smoking status: Former Smoker    Types: Cigars  . Smokeless tobacco: Not on file  . Alcohol Use: Yes     Comment: social- occas.  . Drug Use: No  . Sexual Activity:    Other Topics Concern  . Not on file   Social History Narrative  . No narrative on file    REVIEW OF SYSTEMS: Constitutional: No fevers, chills, or sweats, no generalized fatigue, change in appetite Eyes: No visual changes, double vision, eye pain Ear, nose and throat: No hearing loss, ear pain, nasal congestion, sore throat Cardiovascular: No chest pain, palpitations Respiratory:  No shortness of breath at rest or with exertion, wheezes GastrointestinaI: No nausea, vomiting, diarrhea, abdominal pain, fecal incontinence Genitourinary:  No dysuria, urinary retention or frequency Musculoskeletal:  No neck pain, back pain Integumentary: No rash, pruritus, skin lesions Neurological: as above Psychiatric: No depression, insomnia, anxiety Endocrine: No palpitations, fatigue, diaphoresis, mood swings, change in appetite, change in weight, increased thirst Hematologic/Lymphatic:  No anemia, purpura, petechiae. Allergic/Immunologic: no itchy/runny eyes, nasal congestion, recent allergic reactions,  rashes  PHYSICAL EXAM: Filed Vitals:   02/05/14 1424  BP: 120/58  Pulse: 83  Resp: 14   General: No acute distress Head:  Normocephalic/atraumatic Neck: supple, no paraspinal tenderness, full range of motion Back: No paraspinal tenderness Heart: regular rate and rhythm Lungs: Clear to auscultation bilaterally. Vascular: No carotid bruits. Neurological Exam: Mental status: alert and oriented to person, place, and time, recent and remote memory intact, fund of knowledge intact, attention and concentration intact, speech fluent and not dysarthric, language intact. Cranial nerves: CN I: not tested CN II: pupils equal, round and reactive to light, visual fields intact, fundi unremarkable, without vessel changes, exudates, hemorrhages or papilledema. CN III, IV, VI:  full range of motion, no nystagmus,  no ptosis CN V: facial sensation intact CN VII: upper and lower face symmetric CN VIII: hearing intact CN IX, X: gag intact, uvula midline CN XI: sternocleidomastoid and trapezius muscles intact CN XII: tongue midline Bulk & Tone: normal, no fasciculations. Motor: 5/5 throughout, fine finger movements normal and symmetric Sensation: reduced vibration sensation up to below the knees.  Pinprick sensation intact. Deep Tendon Reflexes:1+ throughout except absent in the ankles.  Toes downgoing. Finger to nose testing: no dysmetria. Gait: cautious, mildly wide-based.  Stumbled a little.  Slightly unsteady when turning around.  Unable to tandem walk. Romberg with sway.  IMPRESSION: 1.  Gait instability.  Localization may include the peripheral nerves (neuropathy), cervical spine (central stenosis) or intracranial (cerebrovascular disease, mass lesion).  She does not exhibit any upper motor neuron signs to suggest cervical myelopathy, but that may be absent in a diabetic in her age-group.  Although she does appear to have neuropathy, I cannot say for sure that it would be the primary cause as her  diabetes has been well-controlled for many years. 2.  Episode of black out.  Unclear etiology.  3.  Peripheral neuropathy  PLAN: 1.  We will check MRI of brain and cervical spine without contrast. 2.  We will check B12 and TSH 3.  We will check EEG. 4.  Continue ASA 12m daily 5.  Follow up after exams.  45 minutes spent with patient, over 50% spent counseling, discussing differential diagnoses and coordinating care.  Thank you for allowing me to take part in the care of this patient.  AMetta Clines DO  CC:  JNehemiah Settle MD

## 2014-02-05 NOTE — Patient Instructions (Addendum)
At this point, the balance problems could be related to neuropathy in the feet, pinching of the spinal cord in the neck or from the brain. 1.  We will check MRI of the brain and cervical spine [facility] 02/26/14 at 3:45 pm  2.  We will also check EEG 3.  We will check TSH and B12 levels in the blood 4.  Use the cane 5.  Follow up after tests.

## 2014-02-06 ENCOUNTER — Telehealth: Payer: Self-pay | Admitting: *Deleted

## 2014-02-06 NOTE — Telephone Encounter (Signed)
Message copied by Claudie Revering on Wed Feb 06, 2014 10:58 AM ------      Message from: JAFFE, ADAM R      Created: Wed Feb 06, 2014  6:46 AM       Blood work for B12 and TSH are okay      ----- Message -----         From: Lab in [facility] Interface         Sent: 02/05/2014  11:16 PM           To: Dudley Major, DO                   ------

## 2014-02-06 NOTE — Telephone Encounter (Signed)
Patient is aware of Lab results

## 2014-02-26 ENCOUNTER — Ambulatory Visit (HOSPITAL_COMMUNITY): Admission: RE | Admit: 2014-02-26 | Payer: Medicare Other | Source: Ambulatory Visit

## 2014-02-26 ENCOUNTER — Ambulatory Visit (HOSPITAL_COMMUNITY): Payer: Medicare Other

## 2014-02-27 ENCOUNTER — Ambulatory Visit (INDEPENDENT_AMBULATORY_CARE_PROVIDER_SITE_OTHER): Payer: Medicare Other | Admitting: Neurology

## 2014-02-27 ENCOUNTER — Telehealth: Payer: Self-pay | Admitting: *Deleted

## 2014-02-27 DIAGNOSIS — G609 Hereditary and idiopathic neuropathy, unspecified: Secondary | ICD-10-CM

## 2014-02-27 DIAGNOSIS — R55 Syncope and collapse: Secondary | ICD-10-CM

## 2014-02-27 DIAGNOSIS — R2681 Unsteadiness on feet: Secondary | ICD-10-CM

## 2014-02-27 DIAGNOSIS — R269 Unspecified abnormalities of gait and mobility: Secondary | ICD-10-CM

## 2014-02-27 MED ORDER — DIAZEPAM 5 MG PO TABS: 5.0000 mg | ORAL_TABLET | Freq: Once | ORAL | Status: DC

## 2014-02-27 NOTE — Telephone Encounter (Signed)
Error

## 2014-02-28 ENCOUNTER — Telehealth: Payer: Self-pay | Admitting: *Deleted

## 2014-02-28 NOTE — Telephone Encounter (Signed)
Patient is aware of normal eeg 

## 2014-02-28 NOTE — Procedures (Signed)
ELECTROENCEPHALOGRAM REPORT  Date of Study: 02/27/2014  Patient's Name: Debbie Montoya MRN: [id number] Date of Birth: 1935-11-03  Referring Provider: Stephan Minister. , DO  Indication: Black out  Medications: Bupropion, gabapentin, lorazepam prn, Norco prn  Technical Summary: This is a multichannel digital EEG recording, using the international 10-20 placement system.  Spike detection software was employed.  Description: The EEG background is symmetric, with a well-developed posterior dominant rhythm of 8.5 Hz, which is reactive to eye opening and closing.  Diffuse beta activity is seen, with a bilateral frontal preponderance.  No focal or generalized abnormalities are seen.  No focal or generalized epileptiform discharges are seen.  Stage II sleep is not seen.  Hyperventilation and photic stimulation were performed, and produced no abnormalities.  ECG revealed normal cardiac rate and rhythm.  Impression: This is a normal routine EEG of the awake and drowsy states, with activating procedures.  A normal study does not rule out the possibility of a seizure disorder in this patient.   R. Tomi Likens, DO

## 2014-02-28 NOTE — Telephone Encounter (Signed)
Message copied by Claudie Revering on Thu Feb 28, 2014  9:39 AM ------      Message from: JAFFE, ADAM R      Created: Thu Feb 28, 2014  6:33 AM       eeg normal      ----- Message -----         From: Dudley Major, DO         Sent: 02/28/2014   6:33 AM           To: Dudley Major, DO                   ------

## 2014-03-05 ENCOUNTER — Ambulatory Visit (HOSPITAL_COMMUNITY)
Admission: RE | Admit: 2014-03-05 | Discharge: 2014-03-05 | Disposition: A | Discharge status: Home / Self Care | Payer: Medicare Other | Source: Ambulatory Visit | Attending: Neurology | Admitting: Neurology

## 2014-03-05 DIAGNOSIS — M5020 Other cervical disc displacement, unspecified cervical region: Secondary | ICD-10-CM | POA: Insufficient documentation

## 2014-03-05 DIAGNOSIS — G609 Hereditary and idiopathic neuropathy, unspecified: Secondary | ICD-10-CM

## 2014-03-05 DIAGNOSIS — I6789 Other cerebrovascular disease: Secondary | ICD-10-CM | POA: Insufficient documentation

## 2014-03-05 DIAGNOSIS — M542 Cervicalgia: Secondary | ICD-10-CM | POA: Insufficient documentation

## 2014-03-05 DIAGNOSIS — R2681 Unsteadiness on feet: Secondary | ICD-10-CM

## 2014-03-05 DIAGNOSIS — R269 Unspecified abnormalities of gait and mobility: Secondary | ICD-10-CM | POA: Diagnosis present

## 2014-03-05 DIAGNOSIS — R42 Dizziness and giddiness: Secondary | ICD-10-CM | POA: Diagnosis not present

## 2014-03-05 DIAGNOSIS — G319 Degenerative disease of nervous system, unspecified: Secondary | ICD-10-CM | POA: Diagnosis not present

## 2014-03-05 DIAGNOSIS — R55 Syncope and collapse: Secondary | ICD-10-CM

## 2014-03-05 DIAGNOSIS — M4802 Spinal stenosis, cervical region: Secondary | ICD-10-CM | POA: Diagnosis not present

## 2014-03-05 DIAGNOSIS — M47812 Spondylosis without myelopathy or radiculopathy, cervical region: Secondary | ICD-10-CM | POA: Insufficient documentation

## 2014-03-20 ENCOUNTER — Encounter: Payer: Self-pay | Admitting: Neurology

## 2014-03-20 ENCOUNTER — Ambulatory Visit (INDEPENDENT_AMBULATORY_CARE_PROVIDER_SITE_OTHER): Payer: Medicare Other | Admitting: Neurology

## 2014-03-20 VITALS — BP 112/60 | HR 84 | Resp 18 | Ht 65.0 in | Wt 180.0 lb

## 2014-03-20 DIAGNOSIS — G609 Hereditary and idiopathic neuropathy, unspecified: Secondary | ICD-10-CM

## 2014-03-20 DIAGNOSIS — R269 Unspecified abnormalities of gait and mobility: Secondary | ICD-10-CM

## 2014-03-20 DIAGNOSIS — R2681 Unsteadiness on feet: Secondary | ICD-10-CM

## 2014-03-20 NOTE — Progress Notes (Signed)
NEUROLOGY FOLLOW UP OFFICE NOTE  Debbie Montoya [id number]  HISTORY OF PRESENT ILLNESS: Debbie Montoya is a 78 year old right-handed woman with history of lumbar degenerative disc disease status post discectomy, type 2 diabetes mellitus, dyslipidemia, GERD and depression who follows up for gait instability, syncope and peripheral neuropathy.  UPDATE: MRI of the head and cervical spine without contrast was performed on 03/05/14, revealing no acute intracranial abnormalities and mild spinal stenosis and mild to moderate neural foraminal stenosis.  EEG performed 02/27/14 was normal.  Labs from 02/05/14 revealed B12 of 426 and TSH of 1.965.  She has not had any falls since last visit.  She reports that since last visit, she would experience episodes of diaphoresis, clamminess and nausea, lasting seconds to a minute.  There is no associated headache, visual disturbance, vertigo or loss of consciousness.  It occurs once or twice a day.  She denies any recent change in medication.  HISTORY: In the Fall of 2013, she underwent lumbar surgery.  During PT, she was told that she wasn't picking up her legs enough.  When she walks, she feels clumsy and unsteady.  It is particularly bad when she bends forward.  She denies vertigo, lightheadedness, lower extremity weakness or numbness or pain in the feet.  Her knees don't buckle.  She has chronic back and leg pain since the surgery.  She denies neck pain.  She fell the day before Thanksgiving 2014 and she is not sure why.  She fell again on Christmas Eve, while in the bathroom adjusting her pants.  She simply fell forward, hitting her head.  She is amnestic to the rest of the evening.  She didn't seek medical attention at that time, and she was told later that she probably suffered a concussion.  She had a couple of "little" falls since then.  Since the surgery, she occasionally uses a cane, particularly with prolonged walks.  Frequency of its use fluctuates.  She has  history of diabetes mellitus type II, which was been well controlled for several years.  Recent Hgb A1c from June was 5.5.   In June 2014, she had a family reunion at her home.  The next day, she said she couldn't remember the entire previous day.  She could recall snippets of the party but that is all.  She did not hit her head.  She was not aware by anyone that she was acting strange.  That next morning, she drove to [place] to see her daughter.  On the drive, she felt sleepy, so she stopped off at a [employer] and took a nap.  When she arrived, her daughter told her she was "stumbly".  She was fine the next day.    11/08/12 MRI LUMBAR SPINE W/WO:  interval discectomy and fusion with stable post-surgical changes.  Chronic degeneration at L5-S1 including chronic far lateral protrusion. 10/26/12 CT LUMBAR SPINE WO:  post-operative changes L4-5 with disc bulges at L2-3, L3-4 and L5-S1 without any nerve root compression or stenosis. 08/13/07 MRI LUMBAR SPINE WO:  small disc protrusion and degeneration at L4-5 with mild spinal stenosis and without significant neural compression.  PAST MEDICAL HISTORY: Past Medical History  Diagnosis Date  . Anxiety   . Diabetes mellitus   . GERD (gastroesophageal reflux disease)   . Arthritis     lumbar spondylosis  . Neuromuscular disorder     nerve pain on L leg from back   . Pancreatitis     1980's, pt.  reports that she was hospitalized for 3 weeks, [facility]  . Stroke     2012- seen in ER for supposed , everything checked out OK     MEDICATIONS: Current Outpatient Prescriptions on File Prior to Visit  Medication Sig Dispense Refill  . buPROPion (WELLBUTRIN XL) 150 MG 24 hr tablet Take 150 mg by mouth daily.      . fenofibrate 160 MG tablet Take 160 mg by mouth daily before breakfast.       . FLUoxetine (PROZAC) 20 MG tablet Take 20 mg by mouth daily.      Marland Kitchen gabapentin (NEURONTIN) 300 MG capsule Take 300 mg by mouth daily.       Marland Kitchen HYDROcodone-acetaminophen  (NORCO) 7.5-325 MG per tablet Take 1 tablet by mouth every 6 (six) hours as needed for pain.      Marland Kitchen LORazepam (ATIVAN) 1 MG tablet Take 1 mg by mouth as needed for anxiety.       Marland Kitchen omeprazole (PRILOSEC) 20 MG capsule Take 20 mg by mouth daily before breakfast.       . pioglitazone-metformin (ACTOPLUS MET) 15-500 MG per tablet Take 1 tablet by mouth daily before breakfast.       . valACYclovir (VALTREX) 1000 MG tablet Take 1,000 mg by mouth 2 (two) times daily as needed (for fever blisters). Patient takes two pills at first sign of fever blister and then two more tablets 12hours later.       No current facility-administered medications on file prior to visit.    ALLERGIES: Allergies  Allergen Reactions  . Tape Other (See Comments)    Tender skin  . Penicillins Swelling and Rash    FAMILY HISTORY: Family History  Problem Relation Age of Onset  . Cancer Father     prostate  . COPD Mother     SOCIAL HISTORY: History   Social History  . Marital Status: Married    Spouse Name: N/A    Number of Children: N/A  . Years of Education: N/A   Occupational History  . Not on file.   Social History Main Topics  . Smoking status: Former Smoker    Types: Cigars  . Smokeless tobacco: Not on file  . Alcohol Use: Yes     Comment: social- occas.  . Drug Use: No  . Sexual Activity:    Other Topics Concern  . Not on file   Social History Narrative  . No narrative on file    REVIEW OF SYSTEMS: Constitutional: No fevers, chills, or sweats, no generalized fatigue, change in appetite Eyes: No visual changes, double vision, eye pain Ear, nose and throat: No hearing loss, ear pain, nasal congestion, sore throat Cardiovascular: No chest pain, palpitations Respiratory:  No shortness of breath at rest or with exertion, wheezes GastrointestinaI: No nausea, vomiting, diarrhea, abdominal pain, fecal incontinence Genitourinary:  No dysuria, urinary retention or frequency Musculoskeletal:  No  neck pain, back pain Integumentary: No rash, pruritus, skin lesions Neurological: as above Psychiatric: No depression, insomnia, anxiety Endocrine: No palpitations, fatigue, diaphoresis, mood swings, change in appetite, change in weight, increased thirst Hematologic/Lymphatic:  No anemia, purpura, petechiae. Allergic/Immunologic: no itchy/runny eyes, nasal congestion, recent allergic reactions, rashes  PHYSICAL EXAM: Filed Vitals:   03/20/14 1248  BP: 112/60  Pulse: 84  Resp: 18   General: No acute distress Head:  Normocephalic/atraumatic Neck: supple, no paraspinal tenderness, full range of motion Heart:  Regular rate and rhythm Lungs:  Clear to auscultation bilaterally Back: No paraspinal  tenderness Neurological Exam: alert and oriented to person, place, and time. Attention span and concentration intact, recent and remote memory intact, fund of knowledge intact.  Speech fluent and not dysarthric, language intact.  CN II-XII intact. Fundoscopic exam unremarkable without vessel changes, exudates, hemorrhages or papilledema.  Bulk and tone normal, muscle strength 5/5 throughout.  Reduced vibration in the toes.  Pinprick sensation intact.  Deep tendon reflexes 1+ throughout except absent in the ankles, toes downgoing.  Finger to nose intact.  Gait mildly wide-based and occasionally stumbles, Romberg negative.  IMPRESSION: 1.  Gait instability.  MRI unremarkable.  Most likely related to underlying peripheral neuropathy.   2.  Peripheral neuropathy.  Given that her diabetes has been well-controlled for some time, it is less likely the cause.  Workup thus far is unremarkable for an etiology. 3.  Episode of black out, unclear etiology.  EEG and MRI unremarkable.  PLAN: 1.  We will check SPEP/IFE to be complete in a neuropathy workup 2.  Consider NCV-EMG.  Utility may not be useful, since she likely has evidence of neuropathy due to diabetes.  I don't suspect a demyelinating process.  She  reportedly had a NCV performed by Dr. Brien Few, her pain specialist, about a year and a half ago.  We can repeat the NCV-EMG to look for any progression of neuropathy.  She can have the EMG done with [place], but it can also be done with Dr. Brien Few, since he performed the previous study and he can compare his studies. 3.  She should discuss the hot flashes with PCP 4.  Follow up in 3 months.  Metta Clines, DO  CC:  Nehemiah Settle, MD  Marlaine Hind, MD

## 2014-03-20 NOTE — Patient Instructions (Signed)
1.  We will check some more bloodwork looking for cause of neuropathy (SPEP/IFE) 2.  Consider repeat NCV-EMG to compare to prior EMG looking for worsening of neuropathy 3.  Follow up in 3 months or as needed.

## 2014-03-22 LAB — SPEP & IFE WITH QIG
ALPHA-2-GLOBULIN: 12.1 % — AB (ref 7.1–11.8)
Albumin ELP: 61.7 % (ref 55.8–66.1)
Alpha-1-Globulin: 4 % (ref 2.9–4.9)
Beta 2: 4.6 % (ref 3.2–6.5)
Beta Globulin: 6.5 % (ref 4.7–7.2)
GAMMA GLOBULIN: 11.1 % (ref 11.1–18.8)
IGG (IMMUNOGLOBIN G), SERUM: 702 mg/dL (ref 690–1700)
IGM, SERUM: 78 mg/dL (ref 52–322)
IgA: 91 mg/dL (ref 69–380)
TOTAL PROTEIN, SERUM ELECTROPHOR: 6.8 g/dL (ref 6.0–8.3)

## 2014-03-28 ENCOUNTER — Other Ambulatory Visit: Payer: Self-pay | Admitting: Gastroenterology

## 2014-03-29 ENCOUNTER — Encounter (HOSPITAL_COMMUNITY): Payer: Self-pay | Admitting: Pharmacy Technician

## 2014-03-29 ENCOUNTER — Encounter (HOSPITAL_COMMUNITY): Payer: Self-pay | Admitting: *Deleted

## 2014-04-01 ENCOUNTER — Other Ambulatory Visit: Payer: Self-pay | Admitting: Gastroenterology

## 2014-04-02 ENCOUNTER — Encounter (HOSPITAL_COMMUNITY)
Admission: RE | Disposition: A | Discharge status: Home / Self Care | Payer: Self-pay | Source: Ambulatory Visit | Attending: Gastroenterology

## 2014-04-02 ENCOUNTER — Ambulatory Visit (HOSPITAL_COMMUNITY)
Admission: RE | Admit: 2014-04-02 | Discharge: 2014-04-02 | Disposition: A | Discharge status: Home / Self Care | Payer: Medicare Other | Source: Ambulatory Visit | Attending: Gastroenterology | Admitting: Gastroenterology

## 2014-04-02 ENCOUNTER — Encounter (HOSPITAL_COMMUNITY): Payer: Self-pay

## 2014-04-02 ENCOUNTER — Ambulatory Visit (HOSPITAL_COMMUNITY): Payer: Medicare Other | Admitting: Anesthesiology

## 2014-04-02 ENCOUNTER — Encounter (HOSPITAL_COMMUNITY): Payer: Medicare Other | Admitting: Anesthesiology

## 2014-04-02 DIAGNOSIS — E119 Type 2 diabetes mellitus without complications: Secondary | ICD-10-CM | POA: Insufficient documentation

## 2014-04-02 DIAGNOSIS — Z880 Allergy status to penicillin: Secondary | ICD-10-CM | POA: Insufficient documentation

## 2014-04-02 DIAGNOSIS — Z1211 Encounter for screening for malignant neoplasm of colon: Secondary | ICD-10-CM | POA: Insufficient documentation

## 2014-04-02 DIAGNOSIS — Z9089 Acquired absence of other organs: Secondary | ICD-10-CM | POA: Insufficient documentation

## 2014-04-02 DIAGNOSIS — E781 Pure hyperglyceridemia: Secondary | ICD-10-CM | POA: Insufficient documentation

## 2014-04-02 DIAGNOSIS — F341 Dysthymic disorder: Secondary | ICD-10-CM | POA: Diagnosis not present

## 2014-04-02 DIAGNOSIS — Z90710 Acquired absence of both cervix and uterus: Secondary | ICD-10-CM | POA: Diagnosis not present

## 2014-04-02 DIAGNOSIS — D126 Benign neoplasm of colon, unspecified: Secondary | ICD-10-CM | POA: Insufficient documentation

## 2014-04-02 DIAGNOSIS — Z860100 Personal history of colon polyps, unspecified: Secondary | ICD-10-CM | POA: Insufficient documentation

## 2014-04-02 DIAGNOSIS — K219 Gastro-esophageal reflux disease without esophagitis: Secondary | ICD-10-CM | POA: Insufficient documentation

## 2014-04-02 HISTORY — PX: COLONOSCOPY WITH PROPOFOL: SHX5780

## 2014-04-02 LAB — GLUCOSE, CAPILLARY: GLUCOSE-CAPILLARY: 101 mg/dL — AB (ref 70–99)

## 2014-04-02 SURGERY — COLONOSCOPY WITH PROPOFOL
Anesthesia: Monitor Anesthesia Care

## 2014-04-02 MED ORDER — LIDOCAINE HCL (CARDIAC) 20 MG/ML IV SOLN
INTRAVENOUS | Status: DC | PRN
  Administered 2014-04-02: 75 mg via INTRAVENOUS

## 2014-04-02 MED ORDER — PROPOFOL INFUSION 10 MG/ML OPTIME
INTRAVENOUS | Status: DC | PRN
  Administered 2014-04-02: 140 ug/kg/min via INTRAVENOUS

## 2014-04-02 MED ORDER — PROPOFOL 10 MG/ML IV BOLUS
INTRAVENOUS | Status: AC
  Filled 2014-04-02: qty 20

## 2014-04-02 MED ORDER — LACTATED RINGERS IV SOLN
INTRAVENOUS | Status: DC
Start: 2014-04-02 — End: 2014-04-02
  Administered 2014-04-02: 11:00:00 via INTRAVENOUS

## 2014-04-02 MED ORDER — PROPOFOL 10 MG/ML IV BOLUS
INTRAVENOUS | Status: DC | PRN
  Administered 2014-04-02 (×2): 20 mg via INTRAVENOUS
  Administered 2014-04-02: 30 mg via INTRAVENOUS
  Administered 2014-04-02: 20 mg via INTRAVENOUS
  Administered 2014-04-02: 10 mg via INTRAVENOUS

## 2014-04-02 MED ORDER — LIDOCAINE HCL (CARDIAC) 20 MG/ML IV SOLN
INTRAVENOUS | Status: AC
  Filled 2014-04-02: qty 5

## 2014-04-02 SURGICAL SUPPLY — 22 items

## 2014-04-02 NOTE — Transfer of Care (Signed)
Immediate Anesthesia Transfer of Care Note  Patient: Debbie Montoya  Procedure(s) Performed: Procedure(s): COLONOSCOPY WITH PROPOFOL (N/A)  Patient Location: PACU and Endoscopy Unit  Anesthesia Type:MAC  Level of Consciousness: awake, sedated and responds to stimulation  Airway & Oxygen Therapy: Patient Spontanous Breathing and Patient connected to face mask oxygen  Post-op Assessment: Report given to PACU RN and Post -op Vital signs reviewed and stable  Post vital signs: Reviewed and stable  Complications: No apparent anesthesia complications

## 2014-04-02 NOTE — Anesthesia Preprocedure Evaluation (Signed)
Anesthesia Evaluation  Patient identified by MRN, date of birth, ID band Patient awake    Reviewed: Allergy & Precautions, H&P , NPO status , Patient's Chart, lab work & pertinent test results  Airway Mallampati: II TM Distance: >3 FB Neck ROM: Full    Dental no notable dental hx.    Pulmonary neg pulmonary ROS, former smoker,  breath sounds clear to auscultation  Pulmonary exam normal       Cardiovascular negative cardio ROS  Rhythm:Regular Rate:Normal     Neuro/Psych Anxiety Depression Bipolar Disorder CVA    GI/Hepatic GERD-  Controlled,  Endo/Other  diabetes, Type 2, Oral Hypoglycemic Agents  Renal/GU      Musculoskeletal  (+) Arthritis -,   Abdominal   Peds  Hematology negative hematology ROS (+)   Anesthesia Other Findings   Reproductive/Obstetrics                           Anesthesia Physical  Anesthesia Plan  ASA: III  Anesthesia Plan: MAC   Post-op Pain Management:    Induction: Intravenous  Airway Management Planned:   Additional Equipment:   Intra-op Plan:   Post-operative Plan:   Informed Consent: I have reviewed the patients History and Physical, chart, labs and discussed the procedure including the risks, benefits and alternatives for the proposed anesthesia with the patient or authorized representative who has indicated his/her understanding and acceptance.   Dental advisory given  Plan Discussed with: CRNA  Anesthesia Plan Comments:         Anesthesia Quick Evaluation

## 2014-04-02 NOTE — Op Note (Signed)
Procedure: Surveillance colonoscopy. Diminutive adenomatous colon polyp removed colonoscopically in 2009. Normal air contrast barium enema in 2009.  Endoscopist: Earle Gell  Premedication: Propofol administered by anesthesia  Procedure: The patient was placed in the left lateral decubitus position. Anal inspection and digital rectal exam were normal. The Pentax pediatric colonoscope was introduced into the rectum and advanced to the cecum. A normal-appearing appendiceal orifice was identified. A normal-appearing ileocecal valve was identified. Colonic preparation for the exam today was good.  Rectum. Normal. Retroflexed view of the distal rectum normal  Sigmoid colon and descending colon. Normal  Splenic flexure. Normal  Transverse colon. Normal  Hepatic flexure. Normal  Ascending colon. Normal  Cecum and ileocecal valve. A 5 mm sessile polyp was removed from the proximal rectum with the cold snare and submitted for pathological evaluation. The polypectomy site was endoclipped to close the mucosal defect.  Assessment: A 5 mm sessile polyp was removed from the cecum. Otherwise normal colonoscopy.  Recommendation: Await colon polyp pathology report.

## 2014-04-02 NOTE — H&P (Signed)
  Procedure: Surveillance colonoscopy. In 2009, incomplete colonoscopy with removal of a 2 mm adenomatous colon polyp. Air contrast barium enema performed in 2009 following colonoscopy was normal.  History: The patient is a 78 year old female born 05/08/1936. She is scheduled to undergo a surveillance colonoscopy today.  Past medical history: Tonsillectomy. Appendectomy. Back surgery. Cholecystectomy. Hysterectomy. Type 2 diabetes mellitus. Hypertriglyceridemia complicated by pancreatitis. Anxiety with depression. Gastroesophageal reflux.  Medication allergies: Penicillin  Exam: The patient is alert and comfortably on the endoscopy stretcher. Lungs are clear to auscultation. Cardiac exam reveals a regular rhythm. Abdomen is soft and nontender to palpation.  Plan: Proceed with surveillance colonoscopy

## 2014-04-02 NOTE — Anesthesia Postprocedure Evaluation (Signed)
Anesthesia Post Note  Patient: Debbie Montoya  Procedure(s) Performed: Procedure(s) (LRB): COLONOSCOPY WITH PROPOFOL (N/A)  Anesthesia type: MAC  Patient location: PACU  Post pain: Pain level controlled  Post assessment: Post-op Vital signs reviewed  Last Vitals: BP 150/98  Pulse 74  Temp(Src) 36.7 C (Oral)  Resp 11  SpO2 100%  Post vital signs: Reviewed  Level of consciousness: awake  Complications: No apparent anesthesia complications

## 2014-04-03 ENCOUNTER — Encounter (HOSPITAL_COMMUNITY): Payer: Self-pay | Admitting: Gastroenterology

## 2014-06-03 ENCOUNTER — Other Ambulatory Visit: Payer: Self-pay

## 2014-06-03 DIAGNOSIS — Z1231 Encounter for screening mammogram for malignant neoplasm of breast: Secondary | ICD-10-CM

## 2014-06-18 ENCOUNTER — Telehealth: Payer: Self-pay | Admitting: Neurology

## 2014-06-18 NOTE — Telephone Encounter (Signed)
Pt canceled her f/u appt for 06/20/14 due to her having other provider appt. Will call later to r/s.

## 2014-06-19 ENCOUNTER — Ambulatory Visit: Payer: Medicare Other

## 2014-06-20 ENCOUNTER — Ambulatory Visit: Payer: Medicare Other | Admitting: Neurology

## 2014-08-15 ENCOUNTER — Encounter (HOSPITAL_COMMUNITY): Payer: Self-pay | Admitting: Gastroenterology

## 2014-08-27 ENCOUNTER — Ambulatory Visit
Admission: RE | Admit: 2014-08-27 | Discharge: 2014-08-27 | Disposition: A | Discharge status: Home / Self Care | Payer: Medicare Other | Source: Ambulatory Visit | Attending: Internal Medicine | Admitting: Internal Medicine

## 2014-08-27 ENCOUNTER — Other Ambulatory Visit: Payer: Self-pay | Admitting: Internal Medicine

## 2014-08-27 DIAGNOSIS — M25572 Pain in left ankle and joints of left foot: Secondary | ICD-10-CM

## 2015-02-10 ENCOUNTER — Encounter (HOSPITAL_COMMUNITY): Payer: Self-pay | Admitting: Emergency Medicine

## 2015-02-10 ENCOUNTER — Emergency Department (HOSPITAL_COMMUNITY)
Admission: EM | Admit: 2015-02-10 | Discharge: 2015-02-11 | Disposition: A | Discharge status: Home / Self Care | Payer: Medicare Other | Attending: Emergency Medicine | Admitting: Emergency Medicine

## 2015-02-10 DIAGNOSIS — K219 Gastro-esophageal reflux disease without esophagitis: Secondary | ICD-10-CM | POA: Diagnosis not present

## 2015-02-10 DIAGNOSIS — G8929 Other chronic pain: Secondary | ICD-10-CM | POA: Insufficient documentation

## 2015-02-10 DIAGNOSIS — Z7982 Long term (current) use of aspirin: Secondary | ICD-10-CM | POA: Insufficient documentation

## 2015-02-10 DIAGNOSIS — R1013 Epigastric pain: Secondary | ICD-10-CM | POA: Diagnosis present

## 2015-02-10 DIAGNOSIS — F419 Anxiety disorder, unspecified: Secondary | ICD-10-CM | POA: Diagnosis not present

## 2015-02-10 DIAGNOSIS — R112 Nausea with vomiting, unspecified: Secondary | ICD-10-CM | POA: Insufficient documentation

## 2015-02-10 DIAGNOSIS — Z8673 Personal history of transient ischemic attack (TIA), and cerebral infarction without residual deficits: Secondary | ICD-10-CM | POA: Insufficient documentation

## 2015-02-10 DIAGNOSIS — M1990 Unspecified osteoarthritis, unspecified site: Secondary | ICD-10-CM | POA: Insufficient documentation

## 2015-02-10 DIAGNOSIS — Z79899 Other long term (current) drug therapy: Secondary | ICD-10-CM | POA: Diagnosis not present

## 2015-02-10 DIAGNOSIS — E119 Type 2 diabetes mellitus without complications: Secondary | ICD-10-CM | POA: Insufficient documentation

## 2015-02-10 DIAGNOSIS — Z90710 Acquired absence of both cervix and uterus: Secondary | ICD-10-CM | POA: Diagnosis not present

## 2015-02-10 DIAGNOSIS — Z880 Allergy status to penicillin: Secondary | ICD-10-CM | POA: Insufficient documentation

## 2015-02-10 DIAGNOSIS — Z9049 Acquired absence of other specified parts of digestive tract: Secondary | ICD-10-CM | POA: Insufficient documentation

## 2015-02-10 LAB — DIFFERENTIAL
Basophils Absolute: 0 10*3/uL (ref 0.0–0.1)
Basophils Relative: 0 % (ref 0–1)
EOS PCT: 0 % (ref 0–5)
Eosinophils Absolute: 0 10*3/uL (ref 0.0–0.7)
LYMPHS ABS: 0.8 10*3/uL (ref 0.7–4.0)
Lymphocytes Relative: 8 % — ABNORMAL LOW (ref 12–46)
MONO ABS: 0.4 10*3/uL (ref 0.1–1.0)
Monocytes Relative: 4 % (ref 3–12)
NEUTROS ABS: 9.5 10*3/uL — AB (ref 1.7–7.7)
Neutrophils Relative %: 88 % — ABNORMAL HIGH (ref 43–77)

## 2015-02-10 LAB — URINALYSIS, ROUTINE W REFLEX MICROSCOPIC
Bilirubin Urine: NEGATIVE
Glucose, UA: NEGATIVE mg/dL
Hgb urine dipstick: NEGATIVE
Ketones, ur: NEGATIVE mg/dL
Leukocytes, UA: NEGATIVE
Nitrite: NEGATIVE
Protein, ur: NEGATIVE mg/dL
Specific Gravity, Urine: 1.017 (ref 1.005–1.030)
UROBILINOGEN UA: 1 mg/dL (ref 0.0–1.0)
pH: 8 (ref 5.0–8.0)

## 2015-02-10 LAB — COMPREHENSIVE METABOLIC PANEL
ALK PHOS: 52 U/L (ref 38–126)
ALT: 23 U/L (ref 14–54)
AST: 26 U/L (ref 15–41)
Albumin: 3.9 g/dL (ref 3.5–5.0)
Anion gap: 8 (ref 5–15)
BILIRUBIN TOTAL: 0.9 mg/dL (ref 0.3–1.2)
BUN: 12 mg/dL (ref 6–20)
CO2: 25 mmol/L (ref 22–32)
CREATININE: 0.88 mg/dL (ref 0.44–1.00)
Calcium: 8.8 mg/dL — ABNORMAL LOW (ref 8.9–10.3)
Chloride: 106 mmol/L (ref 101–111)
GFR calc Af Amer: >60 mL/min (ref >60)
GLUCOSE: 157 mg/dL — AB (ref 65–99)
POTASSIUM: 4.1 mmol/L (ref 3.5–5.1)
SODIUM: 139 mmol/L (ref 135–145)
Total Protein: 7 g/dL (ref 6.5–8.1)

## 2015-02-10 LAB — CBC
HEMATOCRIT: 42.1 % (ref 36.0–46.0)
HEMOGLOBIN: 13.9 g/dL (ref 12.0–15.0)
MCH: 31.2 pg (ref 26.0–34.0)
MCHC: 33 g/dL (ref 30.0–36.0)
MCV: 94.4 fL (ref 78.0–100.0)
Platelets: 282 10*3/uL (ref 150–400)
RBC: 4.46 MIL/uL (ref 3.87–5.11)
RDW: 12.8 % (ref 11.5–15.5)
WBC: 11.1 10*3/uL — AB (ref 4.0–10.5)

## 2015-02-10 LAB — LIPASE, BLOOD: Lipase: 15 U/L — ABNORMAL LOW (ref 22–51)

## 2015-02-10 MED ORDER — ONDANSETRON HCL 4 MG/2ML IJ SOLN
4.0000 mg | Freq: Once | INTRAMUSCULAR | Status: AC
  Administered 2015-02-10: 4 mg via INTRAVENOUS
  Filled 2015-02-10: qty 2

## 2015-02-10 MED ORDER — ONDANSETRON HCL 4 MG/2ML IJ SOLN: 4.0000 mg | Freq: Once | INTRAMUSCULAR | Status: AC | PRN

## 2015-02-10 MED ORDER — SODIUM CHLORIDE 0.9 % IV BOLUS (SEPSIS)
1000.0000 mL | Freq: Once | INTRAVENOUS | Status: AC
  Administered 2015-02-10: 1000 mL via INTRAVENOUS

## 2015-02-10 MED ORDER — FENTANYL CITRATE (PF) 100 MCG/2ML IJ SOLN
100.0000 ug | Freq: Once | INTRAMUSCULAR | Status: AC
  Administered 2015-02-10: 100 ug via INTRAVENOUS
  Filled 2015-02-10: qty 2

## 2015-02-10 NOTE — ED Notes (Signed)
Patient is complaining of upper, mid abd pain. Pain radiates to her back. Pain described as sharp, constant. Intensity changes. Pt states the pain woke her up out of her sleep.

## 2015-02-10 NOTE — ED Provider Notes (Signed)
CSN: [id number]     Arrival date & time 02/10/15  2133 History   First MD Initiated Contact with Patient 02/10/15 2245     Chief Complaint  Patient presents with  . Abdominal Pain     (Consider location/radiation/quality/duration/timing/severity/associated sxs/prior Treatment) HPI Is a 79 year old female with a remote history of pancreatitis. She is here with epigastric pain that began this morning which has worsened. She states the pain as "right bad" and feels like a knife in her epigastrium. It radiates into her back. It is worse with movement or palpation. It is been associated with nausea and vomiting but no diarrhea. She is not been able to take her regular pain medications; she is on chronic pain management by Dr. Brien Few for back pain. She has had associated fevers and chills. Her temperature was 101.1 on arrival.  Past Medical History  Diagnosis Date  . Anxiety   . Diabetes mellitus   . GERD (gastroesophageal reflux disease)   . Arthritis     lumbar spondylosis  . Neuromuscular disorder     nerve pain on L leg from back   . Pancreatitis     1980's, pt. reports that she was hospitalized for 3 weeks, [facility]  . Stroke     2012- seen in ER for supposed , everything checked out OK    Past Surgical History  Procedure Laterality Date  . Tonsillectomy    . Appendectomy    . Abdominal hysterectomy    . Cholecystectomy    . Dilation and curettage of uterus    . Back surgery    . Colonoscopy with propofol N/A 04/02/2014    Procedure: COLONOSCOPY WITH PROPOFOL;  Surgeon: Garlan Fair, MD;  Location: [facility];  Service: Endoscopy;  Laterality: N/A;   Family History  Problem Relation Age of Onset  . Cancer Father     prostate  . COPD Mother    History  Substance Use Topics  . Smoking status: Never Smoker   . Smokeless tobacco: Not on file  . Alcohol Use: Yes     Comment: social- occas.   OB History    No data available     Review of Systems  All other systems  reviewed and are negative.   Allergies  Tape and Penicillins  Home Medications   Prior to Admission medications   Medication Sig Start Date End Date Taking? Authorizing Provider  aspirin EC 81 MG tablet Take 81 mg by mouth daily.   Yes Historical Provider, MD  Biotin 5000 MCG CAPS Take 5,000 mcg by mouth daily.   Yes Historical Provider, MD  buPROPion (WELLBUTRIN XL) 150 MG 24 hr tablet Take 150 mg by mouth every morning.    Yes Historical Provider, MD  [facility] MELATONIN 5-10 MG TBCR Take 5 mg by mouth at bedtime as needed. For sleep 11/11/14  Yes Historical Provider, MD  fenofibrate 160 MG tablet Take 160 mg by mouth daily before breakfast.  02/10/12  Yes Historical Provider, MD  FLUoxetine (PROZAC) 20 MG tablet Take 20 mg by mouth every morning.    Yes Historical Provider, MD  gabapentin (NEURONTIN) 300 MG capsule Take 300 mg by mouth 2 (two) times daily.  03/02/12  Yes Historical Provider, MD  HYDROcodone-acetaminophen (NORCO) 7.5-325 MG per tablet Take 1 tablet by mouth every 6 (six) hours as needed for pain.   Yes Historical Provider, MD  LORazepam (ATIVAN) 1 MG tablet Take 1 mg by mouth 2 (two) times daily as needed  for anxiety.    Yes Historical Provider, MD  omeprazole (PRILOSEC) 20 MG capsule Take 20 mg by mouth daily before breakfast.    Yes Historical Provider, MD  pioglitazone-metformin (ACTOPLUS MET) 15-500 MG per tablet Take 1 tablet by mouth daily before breakfast.    Yes Historical Provider, MD  valACYclovir (VALTREX) 1000 MG tablet Take 1,000 mg by mouth 2 (two) times daily as needed (for fever blisters). Patient takes two pills at first sign of fever blister and then two more tablets 12hours later.   Yes Historical Provider, MD   BP 98/48 mmHg  Pulse 85  Temp(Src) 101.1 F (38.4 C) (Oral)  Resp 20  SpO2 98%   Physical Exam  General: Well-developed, well-nourished female in no acute distress; appearance consistent with age of record HENT: normocephalic; atraumatic Eyes:  pupils equal, round and reactive to light; extraocular muscles intact Neck: supple Heart: regular rate and rhythm Lungs: clear to auscultation bilaterally Abdomen: soft; nondistended; epigastric tenderness; no masses or hepatosplenomegaly; bowel sounds present Extremities: No deformity; full range of motion; pulses normal Neurologic: Awake, alert and oriented; motor function intact in all extremities and symmetric; no facial droop Skin: Warm and dry Psychiatric: Normal mood and affect    ED Course  Procedures (including critical care time)   MDM   Nursing notes and vitals signs, including pulse oximetry, reviewed.  Summary of this visit's results, reviewed by myself:  Labs:  Results for orders placed or performed during the hospital encounter of 02/10/15 (from the past 24 hour(s))  Lipase, blood     Status: Abnormal   Collection Time: 02/10/15 10:14 PM  Result Value Ref Range   Lipase 15 (L) 22 - 51 U/L  Comprehensive metabolic panel     Status: Abnormal   Collection Time: 02/10/15 10:14 PM  Result Value Ref Range   Sodium 139 135 - 145 mmol/L   Potassium 4.1 3.5 - 5.1 mmol/L   Chloride 106 101 - 111 mmol/L   CO2 25 22 - 32 mmol/L   Glucose, Bld 157 (H) 65 - 99 mg/dL   BUN 12 6 - 20 mg/dL   Creatinine, Ser 0.88 0.44 - 1.00 mg/dL   Calcium 8.8 (L) 8.9 - 10.3 mg/dL   Total Protein 7.0 6.5 - 8.1 g/dL   Albumin 3.9 3.5 - 5.0 g/dL   AST 26 15 - 41 U/L   ALT 23 14 - 54 U/L   Alkaline Phosphatase 52 38 - 126 U/L   Total Bilirubin 0.9 0.3 - 1.2 mg/dL   GFR calc non Af Amer >60 >60 mL/min   GFR calc Af Amer >60 >60 mL/min   Anion gap 8 5 - 15  CBC     Status: Abnormal   Collection Time: 02/10/15 10:14 PM  Result Value Ref Range   WBC 11.1 (H) 4.0 - 10.5 K/uL   RBC 4.46 3.87 - 5.11 MIL/uL   Hemoglobin 13.9 12.0 - 15.0 g/dL   HCT 42.1 36.0 - 46.0 %   MCV 94.4 78.0 - 100.0 fL   MCH 31.2 26.0 - 34.0 pg   MCHC 33.0 30.0 - 36.0 g/dL   RDW 12.8 11.5 - 15.5 %   Platelets 282  150 - 400 K/uL  Differential     Status: Abnormal   Collection Time: 02/10/15 10:14 PM  Result Value Ref Range   Neutrophils Relative % 88 (H) 43 - 77 %   Neutro Abs 9.5 (H) 1.7 - 7.7 K/uL   Lymphocytes Relative  8 (L) 12 - 46 %   Lymphs Abs 0.8 0.7 - 4.0 K/uL   Monocytes Relative 4 3 - 12 %   Monocytes Absolute 0.4 0.1 - 1.0 K/uL   Eosinophils Relative 0 0 - 5 %   Eosinophils Absolute 0.0 0.0 - 0.7 K/uL   Basophils Relative 0 0 - 1 %   Basophils Absolute 0.0 0.0 - 0.1 K/uL  Urinalysis, Routine w reflex microscopic (not at [facility])     Status: None   Collection Time: 02/10/15 11:36 PM  Result Value Ref Range   Color, Urine YELLOW YELLOW   APPearance CLEAR CLEAR   Specific Gravity, Urine 1.017 1.005 - 1.030   pH 8.0 5.0 - 8.0   Glucose, UA NEGATIVE NEGATIVE mg/dL   Hgb urine dipstick NEGATIVE NEGATIVE   Bilirubin Urine NEGATIVE NEGATIVE   Ketones, ur NEGATIVE NEGATIVE mg/dL   Protein, ur NEGATIVE NEGATIVE mg/dL   Urobilinogen, UA 1.0 0.0 - 1.0 mg/dL   Nitrite NEGATIVE NEGATIVE   Leukocytes, UA NEGATIVE NEGATIVE    Imaging Studies: Ct Abdomen Pelvis W Contrast  02/11/2015   CLINICAL DATA:  Upper mid abdominal pain radiating to the back. Fever.  EXAM: CT ABDOMEN AND PELVIS WITH CONTRAST  TECHNIQUE: Multidetector CT imaging of the abdomen and pelvis was performed using the standard protocol following bolus administration of intravenous contrast.  CONTRAST:  167m OMNIPAQUE IOHEXOL 300 MG/ML  SOLN  COMPARISON:  None.  FINDINGS: The lung bases are clear. Surgical absence of the gallbladder. No bile duct dilatation. The liver, spleen, pancreas, adrenal glands, kidneys, inferior vena cava, and retroperitoneal lymph nodes are unremarkable. Calcification of the aorta without aneurysm. Stomach, small bowel, and colon are not abnormally distended. No free air or free fluid in the abdomen. Abdominal wall musculature appears intact.  Pelvis: Uterus is surgically absent. No abnormal pelvic mass or  lymphadenopathy. Small amount of free fluid in the pelvis is nonspecific in could be inflammatory. Appendix is not identified. Sigmoid colon is unremarkable. Postoperative changes with posterior laminectomy, posterior fixation, and intervertebral disc prosthesis at L4-5. Degenerative changes in the spine. No destructive bone lesions.  IMPRESSION: No acute process demonstrated in the abdomen or pelvis. No evidence of bowel obstruction.   Electronically Signed   By: WLucienne CapersM.D.   On: 02/11/2015 02:44   2:58 AM Patient feeling better after IV medications. She is still having some neck pain as well as a headache. She was advised of her unremarkable CT and lab studies. This may represent a viral gastroenteritis. She does not wish admission and would like to try outpatient management.   JShanon Rosser MD 02/11/15 [phone]

## 2015-02-10 NOTE — ED Notes (Signed)
Pt states she did not feel well yesterday  Just felt kind of bad and had some dizziness  Pt states she did not sleep well last night  Pt states today she has had pain in her epigastric area that goes through to her back and she has had nausea and vomiting  Pt states she also feels like she had a fever but was unable to take her temperature because her thermometer is broken

## 2015-02-11 ENCOUNTER — Encounter (HOSPITAL_COMMUNITY): Payer: Self-pay

## 2015-02-11 ENCOUNTER — Emergency Department (HOSPITAL_COMMUNITY): Payer: Medicare Other

## 2015-02-11 MED ORDER — IOHEXOL 300 MG/ML  SOLN
50.0000 mL | Freq: Once | INTRAMUSCULAR | Status: AC | PRN
  Administered 2015-02-11: 50 mL via ORAL

## 2015-02-11 MED ORDER — METOCLOPRAMIDE HCL 5 MG/ML IJ SOLN
5.0000 mg | Freq: Once | INTRAMUSCULAR | Status: AC
  Administered 2015-02-11: 5 mg via INTRAVENOUS
  Filled 2015-02-11: qty 2

## 2015-02-11 MED ORDER — FENTANYL CITRATE (PF) 100 MCG/2ML IJ SOLN
100.0000 ug | Freq: Once | INTRAMUSCULAR | Status: AC
  Administered 2015-02-11: 100 ug via INTRAVENOUS
  Filled 2015-02-11: qty 2

## 2015-02-11 MED ORDER — IOHEXOL 300 MG/ML  SOLN
100.0000 mL | Freq: Once | INTRAMUSCULAR | Status: AC | PRN
  Administered 2015-02-11: 100 mL via INTRAVENOUS

## 2015-02-11 MED ORDER — ONDANSETRON 8 MG PO TBDP: 8.0000 mg | ORAL_TABLET | Freq: Three times a day (TID) | ORAL | Status: DC | PRN

## 2015-06-02 ENCOUNTER — Ambulatory Visit: Payer: Medicare Other | Admitting: Podiatry

## 2015-06-16 ENCOUNTER — Encounter: Payer: Self-pay | Admitting: Podiatry

## 2015-06-16 ENCOUNTER — Ambulatory Visit (INDEPENDENT_AMBULATORY_CARE_PROVIDER_SITE_OTHER): Payer: Medicare Other

## 2015-06-16 ENCOUNTER — Ambulatory Visit (INDEPENDENT_AMBULATORY_CARE_PROVIDER_SITE_OTHER): Payer: Medicare Other | Admitting: Podiatry

## 2015-06-16 VITALS — BP 129/72 | HR 71 | Resp 16

## 2015-06-16 DIAGNOSIS — M722 Plantar fascial fibromatosis: Secondary | ICD-10-CM | POA: Diagnosis not present

## 2015-06-16 MED ORDER — TRIAMCINOLONE ACETONIDE 10 MG/ML IJ SUSP
10.0000 mg | Freq: Once | INTRAMUSCULAR | Status: AC
  Administered 2015-06-16: 10 mg

## 2015-06-16 NOTE — Patient Instructions (Signed)

## 2015-06-16 NOTE — Progress Notes (Signed)
Subjective:     Patient ID: Debbie Montoya, female   DOB: 11-14-1935, 79 y.o.   MRN: [id number]  HPI patient states my right heel has been hurting me for the last few months and making it hard to walk or hard to 2 any types of activities   Review of Systems  All other systems reviewed and are negative.      Objective:   Physical Exam  Constitutional: She is oriented to person, place, and time.  Cardiovascular: Intact distal pulses.   Musculoskeletal: Normal range of motion.  Neurological: She is oriented to person, place, and time.  Skin: Skin is warm.  Nursing note and vitals reviewed.  neurovascular status intact muscle strength adequate range of motion within normal limits with patient noted to have discomfort in the plantar aspect right heel at the insertional point tendon into the calcaneus with inflammation and fluid buildup noted. Patient is found to have good digital perfusion is well oriented 3 with no equinus     Assessment:     Acute plantar fasciitis right at the insertional point tendon calcaneus    Plan:     H&P and condition reviewed with patient. Injected the right plantar fascia 3 mg Kenalog 5 mg Xylocaine and applied fascial brace with instructions on usage

## 2015-06-16 NOTE — Progress Notes (Signed)
   Subjective:    Patient ID: Debbie Montoya, female    DOB: [birth year], 79 y.o.   MRN: [id number]  HPI Pt presents with right heel pain lasting several months, pain is worse after resting then trying to ambulate, pain radiates toward plantar forefoot and laterally, she has tried icing and stretching   Review of Systems  Hematological: Bruises/bleeds easily.  Psychiatric/Behavioral: The patient is nervous/anxious.   All other systems reviewed and are negative.      Objective:   Physical Exam        Assessment & Plan:

## 2015-06-23 ENCOUNTER — Encounter: Payer: Self-pay | Admitting: Podiatry

## 2015-06-23 ENCOUNTER — Ambulatory Visit (INDEPENDENT_AMBULATORY_CARE_PROVIDER_SITE_OTHER): Payer: Medicare Other | Admitting: Podiatry

## 2015-06-23 VITALS — BP 118/69 | HR 75 | Resp 16

## 2015-06-23 DIAGNOSIS — M722 Plantar fascial fibromatosis: Secondary | ICD-10-CM | POA: Diagnosis not present

## 2015-06-23 MED ORDER — TRIAMCINOLONE ACETONIDE 10 MG/ML IJ SUSP
10.0000 mg | Freq: Once | INTRAMUSCULAR | Status: AC
  Administered 2015-06-23: 10 mg

## 2015-06-23 NOTE — Progress Notes (Signed)
Subjective:     Patient ID: Debbie Montoya, female   DOB: 1935-12-04, 79 y.o.   MRN: [id number]  HPI patient states that my right heel is still hurting with mild improvement but it's very bad when I get up in the morning or after sitting   Review of Systems     Objective:   Physical Exam Neurovascular status intact muscle strength was adequate with exquisite discomfort plantar aspect right heel at the insertional point tendon into the calcaneus with inflammation and fluid around the medial band    Assessment:     Continued plantar fasciitis right    Plan:     Reinjected the plantar fascia 3 mg Kenalog 5 mg Xylocaine and went ahead today and dispensed night splint with all instructions on usage and reappoint again in 3 weeks

## 2015-07-16 ENCOUNTER — Ambulatory Visit: Payer: Medicare Other | Admitting: Podiatry

## 2015-08-05 DIAGNOSIS — Z79899 Other long term (current) drug therapy: Secondary | ICD-10-CM | POA: Diagnosis not present

## 2015-08-05 DIAGNOSIS — F1120 Opioid dependence, uncomplicated: Secondary | ICD-10-CM | POA: Diagnosis not present

## 2015-08-05 DIAGNOSIS — M961 Postlaminectomy syndrome, not elsewhere classified: Secondary | ICD-10-CM | POA: Diagnosis not present

## 2015-08-05 DIAGNOSIS — M461 Sacroiliitis, not elsewhere classified: Secondary | ICD-10-CM | POA: Diagnosis not present

## 2015-08-06 ENCOUNTER — Ambulatory Visit (INDEPENDENT_AMBULATORY_CARE_PROVIDER_SITE_OTHER): Payer: PPO | Admitting: Podiatry

## 2015-08-06 DIAGNOSIS — M722 Plantar fascial fibromatosis: Secondary | ICD-10-CM

## 2015-08-11 NOTE — Progress Notes (Signed)
Subjective:     Patient ID: Debbie Montoya, female   DOB: 01/19/1936, 80 y.o.   MRN: [id number]  HPI patient states that she is feeling well   Review of Systems     Objective:   Physical Exam Neurovascular status intact with patient doing well post heel pain with minimal discomfort upon palpation    Assessment:     Improve plantar fasciitis    Plan:     Advised on physical therapy anti-inflammatories and continued supportive shoe gear usage. Patient will be seen back as needed

## 2015-08-13 DIAGNOSIS — M47816 Spondylosis without myelopathy or radiculopathy, lumbar region: Secondary | ICD-10-CM | POA: Diagnosis not present

## 2015-08-19 DIAGNOSIS — E119 Type 2 diabetes mellitus without complications: Secondary | ICD-10-CM | POA: Diagnosis not present

## 2015-09-24 DIAGNOSIS — M5126 Other intervertebral disc displacement, lumbar region: Secondary | ICD-10-CM | POA: Diagnosis not present

## 2015-09-24 DIAGNOSIS — M5416 Radiculopathy, lumbar region: Secondary | ICD-10-CM | POA: Diagnosis not present

## 2015-09-25 ENCOUNTER — Emergency Department (HOSPITAL_COMMUNITY): Payer: PPO

## 2015-09-25 ENCOUNTER — Encounter (HOSPITAL_COMMUNITY): Payer: Self-pay | Admitting: Emergency Medicine

## 2015-09-25 ENCOUNTER — Emergency Department (HOSPITAL_COMMUNITY)
Admission: EM | Admit: 2015-09-25 | Discharge: 2015-09-25 | Disposition: A | Discharge status: Home / Self Care | Payer: PPO | Attending: Emergency Medicine | Admitting: Emergency Medicine

## 2015-09-25 DIAGNOSIS — K219 Gastro-esophageal reflux disease without esophagitis: Secondary | ICD-10-CM | POA: Insufficient documentation

## 2015-09-25 DIAGNOSIS — Z8673 Personal history of transient ischemic attack (TIA), and cerebral infarction without residual deficits: Secondary | ICD-10-CM | POA: Diagnosis not present

## 2015-09-25 DIAGNOSIS — M47816 Spondylosis without myelopathy or radiculopathy, lumbar region: Secondary | ICD-10-CM | POA: Insufficient documentation

## 2015-09-25 DIAGNOSIS — R0789 Other chest pain: Secondary | ICD-10-CM | POA: Diagnosis not present

## 2015-09-25 DIAGNOSIS — Z79899 Other long term (current) drug therapy: Secondary | ICD-10-CM | POA: Insufficient documentation

## 2015-09-25 DIAGNOSIS — Z880 Allergy status to penicillin: Secondary | ICD-10-CM | POA: Diagnosis not present

## 2015-09-25 DIAGNOSIS — Z8669 Personal history of other diseases of the nervous system and sense organs: Secondary | ICD-10-CM | POA: Diagnosis not present

## 2015-09-25 DIAGNOSIS — F419 Anxiety disorder, unspecified: Secondary | ICD-10-CM | POA: Insufficient documentation

## 2015-09-25 DIAGNOSIS — E119 Type 2 diabetes mellitus without complications: Secondary | ICD-10-CM | POA: Insufficient documentation

## 2015-09-25 DIAGNOSIS — R079 Chest pain, unspecified: Secondary | ICD-10-CM | POA: Diagnosis not present

## 2015-09-25 LAB — CBC
HCT: 40.8 % (ref 36.0–46.0)
Hemoglobin: 13.5 g/dL (ref 12.0–15.0)
MCH: 31.4 pg (ref 26.0–34.0)
MCHC: 33.1 g/dL (ref 30.0–36.0)
MCV: 94.9 fL (ref 78.0–100.0)
Platelets: 290 K/uL (ref 150–400)
RBC: 4.3 MIL/uL (ref 3.87–5.11)
RDW: 12.6 % (ref 11.5–15.5)
WBC: 8.7 K/uL (ref 4.0–10.5)

## 2015-09-25 LAB — BASIC METABOLIC PANEL
ANION GAP: 12 (ref 5–15)
BUN: 12 mg/dL (ref 6–20)
CALCIUM: 9.8 mg/dL (ref 8.9–10.3)
CO2: 25 mmol/L (ref 22–32)
Chloride: 104 mmol/L (ref 101–111)
Creatinine, Ser: 0.93 mg/dL (ref 0.44–1.00)
GFR, EST NON AFRICAN AMERICAN: 58 mL/min — AB (ref >60)
GLUCOSE: 209 mg/dL — AB (ref 65–99)
POTASSIUM: 4.2 mmol/L (ref 3.5–5.1)
Sodium: 141 mmol/L (ref 135–145)

## 2015-09-25 LAB — I-STAT TROPONIN, ED
Troponin i, poc: 0 ng/mL (ref 0.00–0.08)
Troponin i, poc: 0 ng/mL (ref 0.00–0.08)

## 2015-09-25 MED ORDER — OXYCODONE-ACETAMINOPHEN 5-325 MG PO TABS
1.0000 | ORAL_TABLET | Freq: Once | ORAL | Status: AC
  Administered 2015-09-25: 1 via ORAL
  Filled 2015-09-25: qty 1

## 2015-09-25 NOTE — ED Provider Notes (Signed)
CSN: [id number]     Arrival date & time 09/25/15  1448 History   First MD Initiated Contact with Patient 09/25/15 1959     Chief Complaint  Patient presents with  . Chest Pain     Patient is a 80 y.o. female presenting with chest pain. The history is provided by the patient. No language interpreter was used.  Chest Pain  Debbie Montoya is a 80 y.o. female who presents to the Emergency Department complaining of chest pain/back pain. She developed left sided chest and upper back pain that is squeezing in nature.   Pain started about 10 am and is constant in nature.  Pain radiates down her left arm.  No recent injuries or illness.  No fever, SOB, diaphoresis, abdominal pain, vomiting, leg swelling or pain.  She had an epidural injection in her lower back yesterday for chronic back pain.  No prior similar sxs.  She has a hx/o DM.  No hx/o HTN, DVT/PE, heart disease.    Past Medical History  Diagnosis Date  . Anxiety   . Diabetes mellitus   . GERD (gastroesophageal reflux disease)   . Arthritis     lumbar spondylosis  . Neuromuscular disorder ([place])     nerve pain on L leg from back   . Pancreatitis     1980's, pt. reports that she was hospitalized for 3 weeks, [facility]  . Stroke [facility])     2012- seen in ER for supposed , everything checked out OK    Past Surgical History  Procedure Laterality Date  . Tonsillectomy    . Appendectomy    . Abdominal hysterectomy    . Cholecystectomy    . Dilation and curettage of uterus    . Back surgery    . Colonoscopy with propofol N/A 04/02/2014    Procedure: COLONOSCOPY WITH PROPOFOL;  Surgeon: Garlan Fair, MD;  Location: [facility];  Service: Endoscopy;  Laterality: N/A;   Family History  Problem Relation Age of Onset  . Cancer Father     prostate  . COPD Mother    Social History  Substance Use Topics  . Smoking status: Never Smoker   . Smokeless tobacco: None  . Alcohol Use: Yes     Comment: social- occas.   OB History    No data  available     Review of Systems  Cardiovascular: Positive for chest pain.  All other systems reviewed and are negative.     Allergies  Tape and Penicillins  Home Medications   Prior to Admission medications   Medication Sig Start Date End Date Taking? Authorizing Provider  Biotin 5000 MCG CAPS Take 5,000 mcg by mouth daily.   Yes Historical Provider, MD  buPROPion (WELLBUTRIN XL) 150 MG 24 hr tablet Take 150 mg by mouth every morning.    Yes Historical Provider, MD  fenofibrate 160 MG tablet Take 160 mg by mouth daily before breakfast.  02/10/12  Yes Historical Provider, MD  FLUoxetine (PROZAC) 20 MG tablet Take 20 mg by mouth daily.    Yes Historical Provider, MD  gabapentin (NEURONTIN) 300 MG capsule Take 300 mg by mouth 2 (two) times daily.  03/02/12  Yes Historical Provider, MD  HYDROcodone-acetaminophen (NORCO) 7.5-325 MG per tablet Take 1 tablet by mouth every 6 (six) hours as needed for pain.   Yes Historical Provider, MD  LORazepam (ATIVAN) 1 MG tablet Take 1 mg by mouth 2 (two) times daily as needed for anxiety.  Yes Historical Provider, MD  omeprazole (PRILOSEC) 20 MG capsule Take 20 mg by mouth daily before breakfast.    Yes Historical Provider, MD  ondansetron (ZOFRAN ODT) 8 MG disintegrating tablet Take 1 tablet (8 mg total) by mouth every 8 (eight) hours as needed for nausea or vomiting. 02/11/15  Yes Shanon Rosser, MD  [facility]S MELATONIN 5-10 MG TBCR Take 5 mg by mouth at bedtime as needed. For sleep 11/11/14   Historical Provider, MD  valACYclovir (VALTREX) 1000 MG tablet Take 1,000 mg by mouth 2 (two) times daily as needed (for fever blisters). Patient takes two pills at first sign of fever blister and then two more tablets 12hours later.    Historical Provider, MD   BP 143/78 mmHg  Pulse 81  Temp(Src) 98.3 F (36.8 C) (Oral)  Resp 15  SpO2 100% Physical Exam  Constitutional: She is oriented to person, place, and time. She appears well-developed and well-nourished.  HENT:   Head: Normocephalic and atraumatic.  Cardiovascular: Normal rate and regular rhythm.   No murmur heard. Pulmonary/Chest: Effort normal and breath sounds normal. No respiratory distress.  Abdominal: Soft. There is no tenderness. There is no rebound and no guarding.  Musculoskeletal:  Trace pitting edema in BLE.  TTP over left upper back, left shoulder, left upper anterior chest.  Mild pain with ROM of the left shoulder.    Neurological: She is alert and oriented to person, place, and time.  Skin: Skin is warm and dry.  Psychiatric: She has a normal mood and affect. Her behavior is normal.  Nursing note and vitals reviewed.   ED Course  Procedures (including critical care time) Labs Review Labs Reviewed  BASIC METABOLIC PANEL - Abnormal; Notable for the following:    Glucose, Bld 209 (*)    GFR calc non Af Amer 58 (*)    All other components within normal limits  CBC  I-STAT TROPOININ, ED  Randolm Idol, ED    Imaging Review Dg Chest 2 View  09/25/2015  CLINICAL DATA:  Left-sided chest pain EXAM: CHEST  2 VIEW COMPARISON:  None. FINDINGS: Normal heart size. Lungs clear. No pneumothorax. No pleural effusion. IMPRESSION: No active cardiopulmonary disease. Electronically Signed   By: Marybelle Killings M.D.   On: 09/25/2015 15:34   I have personally reviewed and evaluated these images and lab results as part of my medical decision-making.   EKG Interpretation   Date/Time:  Thursday September 25 2015 20:24:24 EST Ventricular Rate:  69 PR Interval:  223 QRS Duration: 73 QT Interval:  379 QTC Calculation: 406 R Axis:   16 Text Interpretation:  Sinus rhythm Prolonged PR interval Low voltage,  precordial leads Confirmed by Hazle Coca [phone]) on 09/25/2015 8:27:39 PM      MDM   Final diagnoses:  Chest wall pain   Pt with hx/o DM here with chest pain that is very reproducible on examination.  Presentation is not c/w ACS, PE, dissection.  Discussed with pt home care for MSK chest  pain, outpatient follow up, return precautions.     Quintella Reichert, MD 09/26/15 [phone]

## 2015-09-25 NOTE — ED Notes (Signed)
Pt c/o throbbing left arm pain radiating to left neck and left back, SOB onset today. Hx DM. No injury.

## 2015-09-25 NOTE — Discharge Instructions (Signed)

## 2015-09-30 DIAGNOSIS — M79602 Pain in left arm: Secondary | ICD-10-CM | POA: Diagnosis not present

## 2015-10-06 DIAGNOSIS — I10 Essential (primary) hypertension: Secondary | ICD-10-CM | POA: Diagnosis not present

## 2015-10-06 DIAGNOSIS — M5412 Radiculopathy, cervical region: Secondary | ICD-10-CM | POA: Diagnosis not present

## 2015-10-06 DIAGNOSIS — Z6829 Body mass index (BMI) 29.0-29.9, adult: Secondary | ICD-10-CM | POA: Diagnosis not present

## 2015-10-08 DIAGNOSIS — M5412 Radiculopathy, cervical region: Secondary | ICD-10-CM | POA: Diagnosis not present

## 2015-10-16 ENCOUNTER — Other Ambulatory Visit: Payer: Self-pay

## 2015-10-16 DIAGNOSIS — Z1231 Encounter for screening mammogram for malignant neoplasm of breast: Secondary | ICD-10-CM

## 2015-10-21 ENCOUNTER — Ambulatory Visit: Payer: PPO

## 2015-10-22 DIAGNOSIS — M5412 Radiculopathy, cervical region: Secondary | ICD-10-CM | POA: Diagnosis not present

## 2015-11-03 DIAGNOSIS — F1120 Opioid dependence, uncomplicated: Secondary | ICD-10-CM | POA: Diagnosis not present

## 2015-11-03 DIAGNOSIS — Z6829 Body mass index (BMI) 29.0-29.9, adult: Secondary | ICD-10-CM | POA: Diagnosis not present

## 2015-11-03 DIAGNOSIS — Z79899 Other long term (current) drug therapy: Secondary | ICD-10-CM | POA: Diagnosis not present

## 2015-11-03 DIAGNOSIS — M5412 Radiculopathy, cervical region: Secondary | ICD-10-CM | POA: Diagnosis not present

## 2015-11-04 DIAGNOSIS — M5412 Radiculopathy, cervical region: Secondary | ICD-10-CM | POA: Diagnosis not present

## 2015-11-04 DIAGNOSIS — Z6830 Body mass index (BMI) 30.0-30.9, adult: Secondary | ICD-10-CM | POA: Diagnosis not present

## 2015-11-06 DIAGNOSIS — M5412 Radiculopathy, cervical region: Secondary | ICD-10-CM | POA: Diagnosis not present

## 2015-11-06 DIAGNOSIS — M5022 Other cervical disc displacement, mid-cervical region, unspecified level: Secondary | ICD-10-CM | POA: Diagnosis not present

## 2015-11-13 ENCOUNTER — Other Ambulatory Visit: Payer: Self-pay | Admitting: Neurosurgery

## 2015-11-13 DIAGNOSIS — M5412 Radiculopathy, cervical region: Secondary | ICD-10-CM | POA: Diagnosis not present

## 2015-11-13 DIAGNOSIS — M50222 Other cervical disc displacement at C5-C6 level: Secondary | ICD-10-CM | POA: Diagnosis not present

## 2015-11-13 DIAGNOSIS — Z6831 Body mass index (BMI) 31.0-31.9, adult: Secondary | ICD-10-CM | POA: Diagnosis not present

## 2015-11-17 ENCOUNTER — Encounter (HOSPITAL_COMMUNITY): Payer: Self-pay

## 2015-11-17 ENCOUNTER — Encounter (HOSPITAL_COMMUNITY)
Admission: RE | Admit: 2015-11-17 | Discharge: 2015-11-17 | Disposition: A | Discharge status: Home / Self Care | Payer: PPO | Source: Ambulatory Visit | Attending: Neurosurgery | Admitting: Neurosurgery

## 2015-11-17 DIAGNOSIS — F419 Anxiety disorder, unspecified: Secondary | ICD-10-CM | POA: Diagnosis not present

## 2015-11-17 DIAGNOSIS — M50222 Other cervical disc displacement at C5-C6 level: Secondary | ICD-10-CM | POA: Diagnosis not present

## 2015-11-17 DIAGNOSIS — M1990 Unspecified osteoarthritis, unspecified site: Secondary | ICD-10-CM | POA: Diagnosis not present

## 2015-11-17 DIAGNOSIS — M5020 Other cervical disc displacement, unspecified cervical region: Secondary | ICD-10-CM | POA: Diagnosis not present

## 2015-11-17 DIAGNOSIS — M50123 Cervical disc disorder at C6-C7 level with radiculopathy: Secondary | ICD-10-CM | POA: Diagnosis not present

## 2015-11-17 DIAGNOSIS — E119 Type 2 diabetes mellitus without complications: Secondary | ICD-10-CM | POA: Diagnosis not present

## 2015-11-17 DIAGNOSIS — K219 Gastro-esophageal reflux disease without esophagitis: Secondary | ICD-10-CM | POA: Diagnosis not present

## 2015-11-17 DIAGNOSIS — M4322 Fusion of spine, cervical region: Secondary | ICD-10-CM | POA: Diagnosis not present

## 2015-11-17 DIAGNOSIS — M542 Cervicalgia: Secondary | ICD-10-CM | POA: Diagnosis not present

## 2015-11-17 HISTORY — DX: Other specified postprocedural states: Z98.890

## 2015-11-17 HISTORY — DX: Herpesviral vesicular dermatitis: B00.1

## 2015-11-17 HISTORY — DX: Nausea with vomiting, unspecified: R11.2

## 2015-11-17 LAB — BASIC METABOLIC PANEL
Anion gap: 9 (ref 5–15)
BUN: 11 mg/dL (ref 6–20)
CHLORIDE: 105 mmol/L (ref 101–111)
CO2: 26 mmol/L (ref 22–32)
CREATININE: 0.92 mg/dL (ref 0.44–1.00)
Calcium: 9.5 mg/dL (ref 8.9–10.3)
GFR calc non Af Amer: 59 mL/min — ABNORMAL LOW (ref >60)
GLUCOSE: 114 mg/dL — AB (ref 65–99)
Potassium: 4.2 mmol/L (ref 3.5–5.1)
Sodium: 140 mmol/L (ref 135–145)

## 2015-11-17 LAB — CBC
HEMATOCRIT: 44.1 % (ref 36.0–46.0)
Hemoglobin: 14 g/dL (ref 12.0–15.0)
MCH: 30.6 pg (ref 26.0–34.0)
MCHC: 31.7 g/dL (ref 30.0–36.0)
MCV: 96.3 fL (ref 78.0–100.0)
Platelets: 283 10*3/uL (ref 150–400)
RBC: 4.58 MIL/uL (ref 3.87–5.11)
RDW: 13.2 % (ref 11.5–15.5)
WBC: 5.9 10*3/uL (ref 4.0–10.5)

## 2015-11-17 LAB — SURGICAL PCR SCREEN
MRSA, PCR: NEGATIVE
Staphylococcus aureus: POSITIVE — AB

## 2015-11-17 LAB — GLUCOSE, CAPILLARY: Glucose-Capillary: 122 mg/dL — ABNORMAL HIGH (ref 65–99)

## 2015-11-17 NOTE — Pre-Procedure Instructions (Signed)
Debbie Montoya  11/17/2015      [id number] - [place], [place] [address] [place] [address] Phone: [phone] Fax: [phone]  [id number] - [place], [place] [address] Phone: [phone] Fax: [phone]    Your procedure is scheduled on Tuesday, April 18th   Report to [facility] Admitting at 2:00 PM             (Arrival time is per request your surgeon's request)   Call this number if you have problems the morning of surgery:  [phone]   Remember:  Do not eat food or drink liquids after midnight tonight.   Take these medicines the morning of surgery with A SIP OF WATER : Wellbutrin, Prozac, Ativan, Prilosec, Oxycodone.                         4-5 days prior to surgery, STOP taking any herbal supplements, vitamins, blood thinners, anti-inflammatories.   Do not wear jewelry, make-up or nail polish.  Do not wear lotions, powders, or perfumes.     Do not shave underarms & legs 48 hours prior to surgery.    Do not bring valuables to the hospital.  [facility] is not responsible for any belongings or valuables.  Contacts, dentures or bridgework may not be worn into surgery.  Leave your suitcase in the car.  After surgery it may be brought to your room. For patients admitted to the hospital, discharge time will be determined by your treatment team.  Name and phone number of your driver:      Please read over the following fact sheets that you were given. Pain Booklet, Coughing and Deep Breathing, MRSA Information and Surgical Site Infection Prevention

## 2015-11-18 ENCOUNTER — Inpatient Hospital Stay (HOSPITAL_COMMUNITY)
Admission: RE | Admit: 2015-11-18 | Discharge: 2015-11-19 | DRG: 473 | Disposition: A | Discharge status: Home / Self Care | Payer: PPO | Source: Ambulatory Visit | Attending: Neurosurgery | Admitting: Neurosurgery

## 2015-11-18 ENCOUNTER — Inpatient Hospital Stay (HOSPITAL_COMMUNITY): Payer: PPO | Admitting: Certified Registered"

## 2015-11-18 ENCOUNTER — Inpatient Hospital Stay (HOSPITAL_COMMUNITY): Payer: PPO

## 2015-11-18 ENCOUNTER — Encounter (HOSPITAL_COMMUNITY)
Admission: RE | Disposition: A | Discharge status: Home / Self Care | Payer: Self-pay | Source: Ambulatory Visit | Attending: Neurosurgery

## 2015-11-18 ENCOUNTER — Encounter (HOSPITAL_COMMUNITY): Payer: Self-pay | Admitting: *Deleted

## 2015-11-18 DIAGNOSIS — K219 Gastro-esophageal reflux disease without esophagitis: Secondary | ICD-10-CM | POA: Diagnosis present

## 2015-11-18 DIAGNOSIS — M50222 Other cervical disc displacement at C5-C6 level: Secondary | ICD-10-CM | POA: Diagnosis not present

## 2015-11-18 DIAGNOSIS — M5020 Other cervical disc displacement, unspecified cervical region: Secondary | ICD-10-CM | POA: Diagnosis present

## 2015-11-18 DIAGNOSIS — Z419 Encounter for procedure for purposes other than remedying health state, unspecified: Secondary | ICD-10-CM

## 2015-11-18 DIAGNOSIS — E119 Type 2 diabetes mellitus without complications: Secondary | ICD-10-CM | POA: Diagnosis present

## 2015-11-18 DIAGNOSIS — M50123 Cervical disc disorder at C6-C7 level with radiculopathy: Principal | ICD-10-CM | POA: Diagnosis present

## 2015-11-18 DIAGNOSIS — M4322 Fusion of spine, cervical region: Secondary | ICD-10-CM | POA: Diagnosis not present

## 2015-11-18 DIAGNOSIS — F419 Anxiety disorder, unspecified: Secondary | ICD-10-CM | POA: Diagnosis present

## 2015-11-18 DIAGNOSIS — M1990 Unspecified osteoarthritis, unspecified site: Secondary | ICD-10-CM | POA: Diagnosis not present

## 2015-11-18 DIAGNOSIS — Z418 Encounter for other procedures for purposes other than remedying health state: Secondary | ICD-10-CM

## 2015-11-18 HISTORY — PX: ANTERIOR CERVICAL DECOMP/DISCECTOMY FUSION: SHX1161

## 2015-11-18 LAB — GLUCOSE, CAPILLARY
Glucose-Capillary: 116 mg/dL — ABNORMAL HIGH (ref 65–99)
Glucose-Capillary: 110 mg/dL — ABNORMAL HIGH (ref 65–99)

## 2015-11-18 SURGERY — ANTERIOR CERVICAL DECOMPRESSION/DISCECTOMY FUSION 2 LEVELS
Anesthesia: General | Site: Neck

## 2015-11-18 MED ORDER — SODIUM CHLORIDE 0.9% FLUSH: 3.0000 mL | INTRAVENOUS | Status: DC | PRN

## 2015-11-18 MED ORDER — THROMBIN 5000 UNITS EX SOLR
CUTANEOUS | Status: DC | PRN
  Administered 2015-11-18 (×2): 5000 [IU] via TOPICAL

## 2015-11-18 MED ORDER — OXYCODONE-ACETAMINOPHEN 5-325 MG PO TABS
1.0000 | ORAL_TABLET | ORAL | Status: DC | PRN
  Administered 2015-11-19 (×4): 2 via ORAL
  Filled 2015-11-18 (×4): qty 2

## 2015-11-18 MED ORDER — PHENOL 1.4 % MT LIQD: 1.0000 | OROMUCOSAL | Status: DC | PRN

## 2015-11-18 MED ORDER — ACETAMINOPHEN 325 MG PO TABS: 650.0000 mg | ORAL_TABLET | ORAL | Status: DC | PRN

## 2015-11-18 MED ORDER — LACTATED RINGERS IV SOLN: INTRAVENOUS | Status: DC

## 2015-11-18 MED ORDER — FENTANYL CITRATE (PF) 250 MCG/5ML IJ SOLN
INTRAMUSCULAR | Status: DC | PRN
  Administered 2015-11-18 (×2): 50 ug via INTRAVENOUS

## 2015-11-18 MED ORDER — MEPERIDINE HCL 25 MG/ML IJ SOLN: 6.2500 mg | INTRAMUSCULAR | Status: DC | PRN

## 2015-11-18 MED ORDER — ROCURONIUM BROMIDE 100 MG/10ML IV SOLN
INTRAVENOUS | Status: DC | PRN
  Administered 2015-11-18: 10 mg via INTRAVENOUS
  Administered 2015-11-18: 40 mg via INTRAVENOUS

## 2015-11-18 MED ORDER — ONDANSETRON 4 MG PO TBDP: 8.0000 mg | ORAL_TABLET | Freq: Three times a day (TID) | ORAL | Status: DC | PRN

## 2015-11-18 MED ORDER — MUPIROCIN 2 % EX OINT
1.0000 "application " | TOPICAL_OINTMENT | Freq: Once | CUTANEOUS | Status: AC
  Administered 2015-11-18: 1 via TOPICAL

## 2015-11-18 MED ORDER — FLUOXETINE HCL 20 MG PO CAPS
20.0000 mg | ORAL_CAPSULE | Freq: Every day | ORAL | Status: DC
  Administered 2015-11-19: 20 mg via ORAL
  Filled 2015-11-18 (×2): qty 1

## 2015-11-18 MED ORDER — LIDOCAINE HCL (CARDIAC) 20 MG/ML IV SOLN
INTRAVENOUS | Status: AC
  Filled 2015-11-18: qty 5

## 2015-11-18 MED ORDER — ONDANSETRON HCL 4 MG/2ML IJ SOLN: 4.0000 mg | INTRAMUSCULAR | Status: DC | PRN

## 2015-11-18 MED ORDER — PHENYLEPHRINE HCL 10 MG/ML IJ SOLN
INTRAMUSCULAR | Status: DC | PRN
  Administered 2015-11-18: 80 ug via INTRAVENOUS

## 2015-11-18 MED ORDER — FENTANYL CITRATE (PF) 250 MCG/5ML IJ SOLN
INTRAMUSCULAR | Status: AC
  Filled 2015-11-18: qty 5

## 2015-11-18 MED ORDER — SODIUM CHLORIDE 0.9 % IV SOLN: 250.0000 mL | INTRAVENOUS | Status: DC

## 2015-11-18 MED ORDER — MUPIROCIN 2 % EX OINT
TOPICAL_OINTMENT | CUTANEOUS | Status: AC
  Filled 2015-11-18: qty 22

## 2015-11-18 MED ORDER — VANCOMYCIN HCL IN DEXTROSE 1-5 GM/200ML-% IV SOLN
INTRAVENOUS | Status: AC
  Administered 2015-11-18: 1000 mg via INTRAVENOUS
  Filled 2015-11-18: qty 200

## 2015-11-18 MED ORDER — HYDROCODONE-ACETAMINOPHEN 5-325 MG PO TABS: 1.0000 | ORAL_TABLET | ORAL | Status: DC | PRN

## 2015-11-18 MED ORDER — BIOTIN 5000 MCG PO CAPS: 5000.0000 ug | ORAL_CAPSULE | Freq: Every day | ORAL | Status: DC

## 2015-11-18 MED ORDER — LORAZEPAM 1 MG PO TABS: 1.0000 mg | ORAL_TABLET | Freq: Two times a day (BID) | ORAL | Status: DC | PRN

## 2015-11-18 MED ORDER — LIDOCAINE-EPINEPHRINE 0.5 %-1:200000 IJ SOLN
INTRAMUSCULAR | Status: DC | PRN
  Administered 2015-11-18: 3 mL via INTRADERMAL

## 2015-11-18 MED ORDER — HEMOSTATIC AGENTS (NO CHARGE) OPTIME
TOPICAL | Status: DC | PRN
  Administered 2015-11-18: 1 via TOPICAL

## 2015-11-18 MED ORDER — PANTOPRAZOLE SODIUM 40 MG PO TBEC
40.0000 mg | DELAYED_RELEASE_TABLET | Freq: Every day | ORAL | Status: DC
  Administered 2015-11-19: 40 mg via ORAL
  Filled 2015-11-18: qty 1

## 2015-11-18 MED ORDER — HYDROMORPHONE HCL 1 MG/ML IJ SOLN: 0.2500 mg | INTRAMUSCULAR | Status: DC | PRN

## 2015-11-18 MED ORDER — ZOLPIDEM TARTRATE 5 MG PO TABS: 5.0000 mg | ORAL_TABLET | Freq: Every evening | ORAL | Status: DC | PRN

## 2015-11-18 MED ORDER — MENTHOL 3 MG MT LOZG: 1.0000 | LOZENGE | OROMUCOSAL | Status: DC | PRN

## 2015-11-18 MED ORDER — MORPHINE SULFATE (PF) 2 MG/ML IV SOLN: 1.0000 mg | INTRAVENOUS | Status: DC | PRN

## 2015-11-18 MED ORDER — FENOFIBRATE 160 MG PO TABS
160.0000 mg | ORAL_TABLET | Freq: Every day | ORAL | Status: DC
  Administered 2015-11-19: 160 mg via ORAL
  Filled 2015-11-18: qty 1

## 2015-11-18 MED ORDER — BUPROPION HCL ER (XL) 150 MG PO TB24
150.0000 mg | ORAL_TABLET | Freq: Every morning | ORAL | Status: DC
  Administered 2015-11-19: 150 mg via ORAL
  Filled 2015-11-18: qty 1

## 2015-11-18 MED ORDER — ROCURONIUM BROMIDE 50 MG/5ML IV SOLN
INTRAVENOUS | Status: AC
  Filled 2015-11-18: qty 1

## 2015-11-18 MED ORDER — PROPOFOL 10 MG/ML IV BOLUS
INTRAVENOUS | Status: DC | PRN
  Administered 2015-11-18: 140 mg via INTRAVENOUS

## 2015-11-18 MED ORDER — SODIUM CHLORIDE 0.9% FLUSH
3.0000 mL | Freq: Two times a day (BID) | INTRAVENOUS | Status: DC
  Administered 2015-11-19: 3 mL via INTRAVENOUS
  Administered 2015-11-19: 02:00:00 via INTRAVENOUS

## 2015-11-18 MED ORDER — 0.9 % SODIUM CHLORIDE (POUR BTL) OPTIME
TOPICAL | Status: DC | PRN
  Administered 2015-11-18: 1000 mL

## 2015-11-18 MED ORDER — SUGAMMADEX SODIUM 500 MG/5ML IV SOLN
INTRAVENOUS | Status: AC
  Filled 2015-11-18: qty 5

## 2015-11-18 MED ORDER — DIAZEPAM 5 MG PO TABS
5.0000 mg | ORAL_TABLET | Freq: Four times a day (QID) | ORAL | Status: DC | PRN
Start: 2015-11-18 — End: 2015-11-19
  Administered 2015-11-19 (×2): 5 mg via ORAL
  Filled 2015-11-18 (×2): qty 1

## 2015-11-18 MED ORDER — ONDANSETRON HCL 4 MG/2ML IJ SOLN
INTRAMUSCULAR | Status: DC | PRN
  Administered 2015-11-18: 4 mg via INTRAVENOUS

## 2015-11-18 MED ORDER — POTASSIUM CHLORIDE IN NACL 20-0.9 MEQ/L-% IV SOLN
INTRAVENOUS | Status: DC
  Administered 2015-11-18: via INTRAVENOUS
  Filled 2015-11-18: qty 1000

## 2015-11-18 MED ORDER — PROPOFOL 10 MG/ML IV BOLUS
INTRAVENOUS | Status: AC
  Filled 2015-11-18: qty 20

## 2015-11-18 MED ORDER — GABAPENTIN 300 MG PO CAPS
300.0000 mg | ORAL_CAPSULE | Freq: Two times a day (BID) | ORAL | Status: DC
  Administered 2015-11-18 – 2015-11-19 (×2): 300 mg via ORAL
  Filled 2015-11-18 (×2): qty 1

## 2015-11-18 MED ORDER — ACETAMINOPHEN 650 MG RE SUPP: 650.0000 mg | RECTAL | Status: DC | PRN

## 2015-11-18 MED ORDER — LIDOCAINE HCL (CARDIAC) 20 MG/ML IV SOLN
INTRAVENOUS | Status: DC | PRN
  Administered 2015-11-18: 60 mg via INTRAVENOUS

## 2015-11-18 MED ORDER — PROCHLORPERAZINE EDISYLATE 5 MG/ML IJ SOLN: 10.0000 mg | INTRAMUSCULAR | Status: DC | PRN

## 2015-11-18 MED ORDER — LACTATED RINGERS IV SOLN
INTRAVENOUS | Status: DC
  Administered 2015-11-18 (×2): via INTRAVENOUS

## 2015-11-18 SURGICAL SUPPLY — 70 items
BIT DRILL NEURO 2X3.1 SFT TUCH (MISCELLANEOUS) ×1 IMPLANT
BNDG GAUZE ELAST 4 BULKY (GAUZE/BANDAGES/DRESSINGS) IMPLANT
BUR DRUM 4.0 (BURR) IMPLANT
BUR DRUM 4.0MM (BURR)
CANISTER SUCT 3000ML PPV (MISCELLANEOUS) ×3 IMPLANT
DECANTER SPIKE VIAL GLASS SM (MISCELLANEOUS) ×3 IMPLANT
DRAPE LAPAROTOMY 100X72 PEDS (DRAPES) ×3 IMPLANT
DRAPE MICROSCOPE LEICA (MISCELLANEOUS) ×3 IMPLANT
DRAPE POUCH INSTRU U-SHP 10X18 (DRAPES) ×3 IMPLANT
DRAPE PROXIMA HALF (DRAPES) IMPLANT
DRILL NEURO 2X3.1 SOFT TOUCH (MISCELLANEOUS) ×3
DURAPREP 6ML APPLICATOR 50/CS (WOUND CARE) ×3 IMPLANT
ELECT COATED BLADE 2.86 ST (ELECTRODE) ×3 IMPLANT
ELECT REM PT RETURN 9FT ADLT (ELECTROSURGICAL) ×3
ELECTRODE REM PT RTRN 9FT ADLT (ELECTROSURGICAL) ×1 IMPLANT
GAUZE SPONGE 4X4 16PLY XRAY LF (GAUZE/BANDAGES/DRESSINGS) IMPLANT
GLOVE BIO SURGEON STRL SZ 6.5 (GLOVE) ×2 IMPLANT
GLOVE BIO SURGEON STRL SZ7 (GLOVE) IMPLANT
GLOVE BIO SURGEON STRL SZ7.5 (GLOVE) IMPLANT
GLOVE BIO SURGEON STRL SZ8 (GLOVE) IMPLANT
GLOVE BIO SURGEON STRL SZ8.5 (GLOVE) IMPLANT
GLOVE BIO SURGEONS STRL SZ 6.5 (GLOVE) ×1
GLOVE BIOGEL M 8.0 STRL (GLOVE) IMPLANT
GLOVE BIOGEL PI IND STRL 6.5 (GLOVE) IMPLANT
GLOVE BIOGEL PI INDICATOR 6.5 (GLOVE) ×2
GLOVE ECLIPSE 6.5 STRL STRAW (GLOVE) ×3 IMPLANT
GLOVE ECLIPSE 7.0 STRL STRAW (GLOVE) IMPLANT
GLOVE ECLIPSE 7.5 STRL STRAW (GLOVE) IMPLANT
GLOVE ECLIPSE 8.0 STRL XLNG CF (GLOVE) IMPLANT
GLOVE ECLIPSE 8.5 STRL (GLOVE) IMPLANT
GLOVE EXAM NITRILE LRG STRL (GLOVE) IMPLANT
GLOVE EXAM NITRILE MD LF STRL (GLOVE) IMPLANT
GLOVE EXAM NITRILE XL STR (GLOVE) IMPLANT
GLOVE EXAM NITRILE XS STR PU (GLOVE) IMPLANT
GLOVE INDICATOR 6.5 STRL GRN (GLOVE) IMPLANT
GLOVE INDICATOR 7.0 STRL GRN (GLOVE) IMPLANT
GLOVE INDICATOR 7.5 STRL GRN (GLOVE) IMPLANT
GLOVE INDICATOR 8.0 STRL GRN (GLOVE) IMPLANT
GLOVE INDICATOR 8.5 STRL (GLOVE) IMPLANT
GLOVE OPTIFIT SS 8.0 STRL (GLOVE) IMPLANT
GLOVE SURG SS PI 6.5 STRL IVOR (GLOVE) IMPLANT
GOWN STRL REUS W/ TWL LRG LVL3 (GOWN DISPOSABLE) ×3 IMPLANT
GOWN STRL REUS W/ TWL XL LVL3 (GOWN DISPOSABLE) IMPLANT
GOWN STRL REUS W/TWL 2XL LVL3 (GOWN DISPOSABLE) IMPLANT
GOWN STRL REUS W/TWL LRG LVL3 (GOWN DISPOSABLE) ×9
GOWN STRL REUS W/TWL XL LVL3 (GOWN DISPOSABLE)
KIT BASIN OR (CUSTOM PROCEDURE TRAY) ×3 IMPLANT
KIT ROOM TURNOVER OR (KITS) ×3 IMPLANT
LIQUID BAND (GAUZE/BANDAGES/DRESSINGS) ×3 IMPLANT
NDL HYPO 25X1 1.5 SAFETY (NEEDLE) ×1 IMPLANT
NDL SPNL 22GX3.5 QUINCKE BK (NEEDLE) ×1 IMPLANT
NEEDLE HYPO 25X1 1.5 SAFETY (NEEDLE) ×3 IMPLANT
NEEDLE SPNL 22GX3.5 QUINCKE BK (NEEDLE) ×3 IMPLANT
NS IRRIG 1000ML POUR BTL (IV SOLUTION) ×3 IMPLANT
PACK LAMINECTOMY NEURO (CUSTOM PROCEDURE TRAY) ×3 IMPLANT
PAD ARMBOARD 7.5X6 YLW CONV (MISCELLANEOUS) ×9 IMPLANT
PIN DISTRACTION 14MM (PIN) ×6 IMPLANT
PLATE R HELIX 38MM (Plate) ×2 IMPLANT
RUBBERBAND STERILE (MISCELLANEOUS) ×6 IMPLANT
SCREW 4.0X13 (Screw) IMPLANT
SCREW 4.0X13MM (Screw) ×12 IMPLANT
SPACER ACF PARALLEL 7MM (Bone Implant) ×4 IMPLANT
SPONGE INTESTINAL PEANUT (DISPOSABLE) ×3 IMPLANT
SPONGE SURGIFOAM ABS GEL SZ50 (HEMOSTASIS) ×3 IMPLANT
SUT VIC AB 0 CT1 27 (SUTURE)
SUT VIC AB 0 CT1 27XBRD ANTBC (SUTURE) IMPLANT
SUT VIC AB 3-0 SH 8-18 (SUTURE) ×3 IMPLANT
TOWEL OR 17X24 6PK STRL BLUE (TOWEL DISPOSABLE) ×3 IMPLANT
TOWEL OR 17X26 10 PK STRL BLUE (TOWEL DISPOSABLE) ×3 IMPLANT
WATER STERILE IRR 1000ML POUR (IV SOLUTION) ×3 IMPLANT

## 2015-11-18 NOTE — Anesthesia Preprocedure Evaluation (Addendum)
Anesthesia Evaluation  Patient identified by MRN, date of birth, ID band Patient awake    Reviewed: Allergy & Precautions, NPO status , Patient's Chart, lab work & pertinent test results  History of Anesthesia Complications (+) PONV and history of anesthetic complications  Airway Mallampati: III  TM Distance: >3 FB Neck ROM: Limited    Dental  (+) Teeth Intact   Pulmonary    breath sounds clear to auscultation       Cardiovascular negative cardio ROS   Rhythm:Regular Rate:Normal     Neuro/Psych PSYCHIATRIC DISORDERS Anxiety  Neuromuscular disease CVA    GI/Hepatic Neg liver ROS, GERD  Medicated,  Endo/Other  diabetes  Renal/GU negative Renal ROS  negative genitourinary   Musculoskeletal  (+) Arthritis ,   Abdominal   Peds negative pediatric ROS (+)  Hematology negative hematology ROS (+)   Anesthesia Other Findings   Reproductive/Obstetrics negative OB ROS                           Lab Results  Component Value Date   WBC 5.9 11/17/2015   HGB 14.0 11/17/2015   HCT 44.1 11/17/2015   MCV 96.3 11/17/2015   PLT 283 11/17/2015   Lab Results  Component Value Date   CREATININE 0.92 11/17/2015   BUN 11 11/17/2015   NA 140 11/17/2015   K 4.2 11/17/2015   CL 105 11/17/2015   CO2 26 11/17/2015   No results found for: INR, PROTIME  09/2015 EKG: normal sinus rhythm, PAC's noted.  Anesthesia Physical Anesthesia Plan  ASA: II  Anesthesia Plan: General   Post-op Pain Management:    Induction: Intravenous  Airway Management Planned: Oral ETT and Video Laryngoscope Planned  Additional Equipment:   Intra-op Plan:   Post-operative Plan: Extubation in OR  Informed Consent: I have reviewed the patients History and Physical, chart, labs and discussed the procedure including the risks, benefits and alternatives for the proposed anesthesia with the patient or authorized representative  who has indicated his/her understanding and acceptance.   Dental advisory given  Plan Discussed with: CRNA  Anesthesia Plan Comments: (No neurological deficits noted on exam. Ms. Colao complains of shoulder pain only, which is not associated with flexion, extension or rotation of the head to the left or right.)       Anesthesia Quick Evaluation

## 2015-11-18 NOTE — Anesthesia Procedure Notes (Signed)
Procedure Name: Intubation Date/Time: 11/18/2015 7:08 PM Performed by: Myna Bright Pre-anesthesia Checklist: Patient identified, Emergency Drugs available, Suction available and Patient being monitored Patient Re-evaluated:Patient Re-evaluated prior to inductionOxygen Delivery Method: Circle system utilized Preoxygenation: Pre-oxygenation with 100% oxygen Intubation Type: IV induction Ventilation: Mask ventilation without difficulty Laryngoscope Size: Mac and 3 Grade View: Grade II Tube type: Oral Tube size: 7.0 mm Number of attempts: 1 Airway Equipment and Method: Stylet Placement Confirmation: ETT inserted through vocal cords under direct vision,  positive ETCO2 and breath sounds checked- equal and bilateral Secured at: 21 cm Tube secured with: Tape Dental Injury: Teeth and Oropharynx as per pre-operative assessment

## 2015-11-18 NOTE — H&P (Signed)
  BP 129/52 mmHg  Pulse 75  Temp(Src) 98.9 F (37.2 C) (Oral)  Resp 18  Ht 5\' 4"  (1.626 m)  Wt 78.019 kg (172 lb)  BMI 29.51 kg/m2  SpO2 97%   Debbie Montoya comes in today at [facility] of her physician for evaluation of severe pain that started about six weeks ago in the neck and left upper extremity.  She is still seeing Dr. Brien Few for injections into the lumbar region and into the cervical region.  She says this pain felt like it was a heart attack.  She was evaluated as such, but had no evidence of a myocardial infarction.  She comes in today and has had no real relief of the pain.     Debbie Montoya returns today with the MRI of the cervical spine.  She had felt a change in the pain that she was having in her neck and left upper extremity.  When I saw her in the beginning of April she had profound weakness in the triceps.  She is at 4/5.  Therefore, I felt she would have a C7 radiculopathy.             DATA:                                                  The MRI showed fresh disk herniation at C6-7 eccentric to the left, corresponding with her physical exam and she also has a disk and narrowing of the left neural foramen at C5-6.            PHYSICAL EXAMINATION:                     She is alert, oriented x 4 and answering all questions appropriately.  Memory, language, attention span and fund of knowledge are normal.  Speech is clear.  It is also fluent.  She has 5/5 strength right upper, both lower extremities, 4/5 strength in her triceps, 5- biceps, which may be pain-mediated.  Lungs clear, heart rrr, abd soft not tender.      IMPRESSION/PLAN:                             Given the weakness, I have proposed that she undergo operative decompression.  She says this has been going on eight weeks.  Her strength clearly has not gotten better and the pain has only gotten worse.  Risks and benefits, bleeding, infection, no relief, fusion failure, hardware failure, paralysis, weakness in one or both  arms, one or both legs, bowel and bladder dysfunction were discussed along with other risks.  I gave her a detailed instruction sheet.  She would like to proceed with the procedure.     Debbie Montoya will be in the operating room next week.

## 2015-11-18 NOTE — Transfer of Care (Signed)
Immediate Anesthesia Transfer of Care Note  Patient: Debbie Montoya  Procedure(s) Performed: Procedure(s) with comments: C5-6 C6-7 Anterior cervical decompression/diskectomy/fusion (N/A) - C5-6 C6-7 Anterior cervical decompression/diskectomy/fusion  Patient Location: PACU  Anesthesia Type:General  Level of Consciousness: awake, alert , oriented and patient cooperative  Airway & Oxygen Therapy: Patient Spontanous Breathing and Patient connected to nasal cannula oxygen  Post-op Assessment: Report given to RN, Post -op Vital signs reviewed and stable and Patient moving all extremities  Post vital signs: Reviewed and stable  Last Vitals:  Filed Vitals:   11/18/15 1444 11/18/15 2200  BP: 129/52 138/89  Pulse: 75 94  Temp: 37.2 C 37.2 C  Resp: 18 18    Complications: No apparent anesthesia complications

## 2015-11-18 NOTE — Anesthesia Postprocedure Evaluation (Signed)
Anesthesia Post Note  Patient: Debbie Montoya  Procedure(s) Performed: Procedure(s) (LRB): C5-6 C6-7 Anterior cervical decompression/diskectomy/fusion (N/A)  Patient location during evaluation: PACU Anesthesia Type: General Level of consciousness: awake and alert, oriented and patient cooperative Pain management: pain level controlled Vital Signs Assessment: post-procedure vital signs reviewed and stable Respiratory status: spontaneous breathing, nonlabored ventilation, respiratory function stable and patient connected to nasal cannula oxygen Cardiovascular status: blood pressure returned to baseline and stable Postop Assessment: no signs of nausea or vomiting Anesthetic complications: no    Last Vitals:  Filed Vitals:   11/18/15 2220 11/18/15 2233  BP: 157/67 145/74  Pulse: 93 94  Temp:  36.7 C  Resp: 19 11    Last Pain:  Filed Vitals:   11/18/15 2235  PainSc: Asleep                 ,E. 

## 2015-11-18 NOTE — Op Note (Signed)
11/18/2015  9:58 PM  PATIENT:  MERCIE POERTNER  80 y.o. female  PRE-OPERATIVE DIAGNOSIS:  Cervical herniated disc C5/6, 6/7  POST-OPERATIVE DIAGNOSIS:  Cervical herniated disc C5/6, 6/7  PROCEDURE:  Anterior Cervical decompression C5/,6/7 Arthrodesis C5/6,6/7 with 47mm structural allograft x2 Anterior instrumentation(Nuvasive Helix R) C5-7  SURGEON:   Surgeon(s): Ashok Pall, MD Leeroy Cha, MD   ASSISTANTS:Botero, Stann Mainland  ANESTHESIA:   general  EBL:  Total I/O In: 1000 [I.V.:1000] Out: 100 [Blood:100]  BLOOD ADMINISTERED:none  CELL SAVER GIVEN:none  COUNT:per nursing  DRAINS: none   SPECIMEN:  No Specimen  DICTATION: Mrs. Langley was taken to the operating room, intubated, and placed under general anesthesia without difficulty. She was positioned supine with her head in slight extension on a horseshoe headrest. The neck was prepped and draped in a sterile manner. I infiltrated 3 cc's 1/2%lidocaine/1:200,000 strength epinephrine into the planned incision starting from the midline to the medial border of the left sternocleidomastoid muscle. I opened the incision with a 10 blade and dissected sharply through soft tissue to the platysma. I dissected in the plane superior to the platysma both rostrally and caudally. I then opened the platysma in a horizontal fashion with Metzenbaum scissors, and dissected in the inferior plane rostrally and caudally. With both blunt and sharp technique I created an avascular corridor to the cervical spine. I placed a spinal needle(s) in the disc space at 4/5 . I then reflected the longus colli from C5 to C7 and placed self retaining retractors. I opened the disc space(s) at 5/6, and 6/7 with a 15 blade. I removed disc with curettes, Kerrison punches, and the drill. Using the drill I removed osteophytes and prepared for the decompression.  I decompressed the spinal canal and the C6, and 7 root(s) with the drill, Kerrison punches, and the curettes. I  used the microscope to aid in microdissection. I removed the posterior longitudinal ligament to fully expose and decompress the thecal sac.  We exposed the roots laterally taking down the 5/6,6/7 uncovertebral joints. With the decompression complete we moved on to the arthrodesis. We used the drill to level the surfaces of C5,6,and 7. I removed soft tissue to prepare the disc space and the bony surfaces. I measured the space and placed a 61mm structural allograft into each of  the disc spaces.  We then placed the anterior instrumentation. I placed 2 screws in each vertebral body through the plate. I locked the screws into place. Intraoperative xray showed the graft, plate, and screws to be in good position. I irrigated the wound, achieved hemostasis, and closed the wound in layers. I approximated the platysma, and the subcuticular plane with vicryl sutures. I used Dermabond for a sterile dressing.   PLAN OF CARE: Admit to inpatient   PATIENT DISPOSITION:  PACU - hemodynamically stable.   Delay start of Pharmacological VTE agent (>24hrs) due to surgical blood loss or risk of bleeding:  yes

## 2015-11-19 ENCOUNTER — Encounter (HOSPITAL_COMMUNITY): Payer: Self-pay | Admitting: Neurosurgery

## 2015-11-19 DIAGNOSIS — M50123 Cervical disc disorder at C6-C7 level with radiculopathy: Secondary | ICD-10-CM | POA: Diagnosis not present

## 2015-11-19 DIAGNOSIS — K219 Gastro-esophageal reflux disease without esophagitis: Secondary | ICD-10-CM | POA: Diagnosis not present

## 2015-11-19 DIAGNOSIS — M542 Cervicalgia: Secondary | ICD-10-CM | POA: Diagnosis not present

## 2015-11-19 DIAGNOSIS — E119 Type 2 diabetes mellitus without complications: Secondary | ICD-10-CM | POA: Diagnosis not present

## 2015-11-19 DIAGNOSIS — F419 Anxiety disorder, unspecified: Secondary | ICD-10-CM | POA: Diagnosis not present

## 2015-11-19 MED ORDER — OXYCODONE-ACETAMINOPHEN 5-325 MG PO TABS: 1.0000 | ORAL_TABLET | Freq: Four times a day (QID) | ORAL | Status: DC | PRN

## 2015-11-19 MED ORDER — TIZANIDINE HCL 4 MG PO TABS
4.0000 mg | ORAL_TABLET | Freq: Four times a day (QID) | ORAL | Status: AC | PRN
Start: 2015-11-19 — End: ?

## 2015-11-19 NOTE — Care Management Note (Signed)
Case Management Note  Patient Details  Name: Debbie Montoya MRN: [id number] Date of Birth: 01/28/1936  Subjective/Objective:    Patient admitted and underwent a C 5-7 ACDF. She is from home with her spouse.                 Action/Plan: CM following for discharge needs.   Expected Discharge Date:                  Expected Discharge Plan:     In-House Referral:     Discharge planning Services     Post Acute Care Choice:    Choice offered to:     DME Arranged:    DME Agency:     HH Arranged:    HH Agency:     Status of Service:  In process, will continue to follow  Medicare Important Message Given:    Date Medicare IM Given:    Medicare IM give by:    Date Additional Medicare IM Given:    Additional Medicare Important Message give by:     If discussed at [place] of Stay Meetings, dates discussed:    Additional Comments:  Pollie Friar, RN 11/19/2015, 4:05 PM

## 2015-11-19 NOTE — Discharge Instructions (Signed)

## 2015-11-19 NOTE — Progress Notes (Signed)
Pt to go home via wheelchair with husband

## 2015-11-19 NOTE — Discharge Summary (Signed)
  Physician Discharge Summary  Patient ID: Debbie Montoya MRN: [id number] DOB/AGE: 1936/05/08 80 y.o.  Admit date: 11/18/2015 Discharge date: 11/19/2015  Admission Diagnoses:HNP cervical C5/6,6/7  Discharge Diagnoses: same Active Problems:   HNP (herniated nucleus pulposus), cervical   Discharged Condition: good  Hospital Course: [name] was admitted and taken to the operating room for an uncomplicated ACDF at [id number]. Post op her triceps strength was improved on the left side, and her pain in the left upper extremity was improved. Her wound is clean, dry, and without signs of infection. Voice is strong.  Treatments: surgery: Anterior Cervical decompression C5/,6/7 Arthrodesis C5/6,6/7 with 65mm structural allograft x2 Anterior instrumentation(Nuvasive Helix R) C5-7   Discharge Exam: Blood pressure 104/46, pulse 88, temperature 98.6 F (37 C), temperature source Oral, resp. rate 16, height 5\' 4"  (1.626 m), weight 85.458 kg (188 lb 6.4 oz), SpO2 96 %. General appearance: alert, cooperative, appears stated age and no distress  Disposition: 01-Home or Self Care Cervical herniated disc    Medication List    TAKE these medications        Biotin 5000 MCG Caps  Take 5,000 mcg by mouth daily.     buPROPion 150 MG 24 hr tablet  Commonly known as:  WELLBUTRIN XL  Take 150 mg by mouth every morning.     fenofibrate 160 MG tablet  Take 160 mg by mouth daily before breakfast.     FLUoxetine 20 MG tablet  Commonly known as:  PROZAC  Take 20 mg by mouth daily.     gabapentin 300 MG capsule  Commonly known as:  NEURONTIN  Take 300 mg by mouth 2 (two) times daily.     LORazepam 1 MG tablet  Commonly known as:  ATIVAN  Take 1 mg by mouth 2 (two) times daily as needed for anxiety.     omeprazole 20 MG capsule  Commonly known as:  PRILOSEC  Take 20 mg by mouth daily before breakfast.     ondansetron 8 MG disintegrating tablet  Commonly known as:  ZOFRAN ODT  Take 1 tablet  (8 mg total) by mouth every 8 (eight) hours as needed for nausea or vomiting.     oxyCODONE-acetaminophen 7.5-325 MG tablet  Commonly known as:  PERCOCET  Take 1 tablet by mouth every 6 (six) hours as needed for moderate pain.     oxyCODONE-acetaminophen 5-325 MG tablet  Commonly known as:  PERCOCET/ROXICET  Take 1 tablet by mouth every 6 (six) hours as needed for moderate pain.     tiZANidine 4 MG tablet  Commonly known as:  ZANAFLEX  Take 1 tablet (4 mg total) by mouth every 6 (six) hours as needed for muscle spasms.     valACYclovir 1000 MG tablet  Commonly known as:  VALTREX  Take 1,000 mg by mouth 2 (two) times daily as needed (for fever blisters). Patient takes two pills at first sign of fever blister and then two more tablets 12hours later.           Follow-up Information    Follow up with , L, MD In 3 weeks.   Specialty:  Neurosurgery   Why:  please call the office to make an appointment   Contact information:   [address] [place] [address] [phone]       Signed: Winfield Cunas 11/19/2015, 6:06 PM

## 2015-11-21 ENCOUNTER — Encounter (HOSPITAL_COMMUNITY): Payer: Self-pay | Admitting: Neurosurgery

## 2015-12-11 DIAGNOSIS — E119 Type 2 diabetes mellitus without complications: Secondary | ICD-10-CM | POA: Diagnosis not present

## 2015-12-11 DIAGNOSIS — H2513 Age-related nuclear cataract, bilateral: Secondary | ICD-10-CM | POA: Diagnosis not present

## 2015-12-22 DIAGNOSIS — M5412 Radiculopathy, cervical region: Secondary | ICD-10-CM | POA: Diagnosis not present

## 2015-12-22 DIAGNOSIS — M50222 Other cervical disc displacement at C5-C6 level: Secondary | ICD-10-CM | POA: Diagnosis not present

## 2015-12-22 DIAGNOSIS — Z79899 Other long term (current) drug therapy: Secondary | ICD-10-CM | POA: Diagnosis not present

## 2015-12-22 DIAGNOSIS — F1120 Opioid dependence, uncomplicated: Secondary | ICD-10-CM | POA: Diagnosis not present

## 2016-02-09 DIAGNOSIS — M50222 Other cervical disc displacement at C5-C6 level: Secondary | ICD-10-CM | POA: Diagnosis not present

## 2016-02-18 DIAGNOSIS — R110 Nausea: Secondary | ICD-10-CM | POA: Diagnosis not present

## 2016-02-18 DIAGNOSIS — J321 Chronic frontal sinusitis: Secondary | ICD-10-CM | POA: Diagnosis not present

## 2016-02-26 ENCOUNTER — Ambulatory Visit
Admission: RE | Admit: 2016-02-26 | Discharge: 2016-02-26 | Disposition: A | Discharge status: Home / Self Care | Payer: PPO | Source: Ambulatory Visit | Attending: Nurse Practitioner | Admitting: Nurse Practitioner

## 2016-02-26 ENCOUNTER — Other Ambulatory Visit: Payer: Self-pay | Admitting: Nurse Practitioner

## 2016-02-26 DIAGNOSIS — R519 Headache, unspecified: Secondary | ICD-10-CM

## 2016-02-26 DIAGNOSIS — R51 Headache: Principal | ICD-10-CM

## 2016-02-26 DIAGNOSIS — F419 Anxiety disorder, unspecified: Secondary | ICD-10-CM | POA: Diagnosis not present

## 2016-02-26 DIAGNOSIS — J029 Acute pharyngitis, unspecified: Secondary | ICD-10-CM | POA: Diagnosis not present

## 2016-02-26 DIAGNOSIS — J0110 Acute frontal sinusitis, unspecified: Secondary | ICD-10-CM | POA: Diagnosis not present

## 2016-03-11 DIAGNOSIS — M47816 Spondylosis without myelopathy or radiculopathy, lumbar region: Secondary | ICD-10-CM | POA: Diagnosis not present

## 2016-03-11 DIAGNOSIS — M5126 Other intervertebral disc displacement, lumbar region: Secondary | ICD-10-CM | POA: Diagnosis not present

## 2016-03-11 DIAGNOSIS — M461 Sacroiliitis, not elsewhere classified: Secondary | ICD-10-CM | POA: Diagnosis not present

## 2016-03-11 DIAGNOSIS — M961 Postlaminectomy syndrome, not elsewhere classified: Secondary | ICD-10-CM | POA: Diagnosis not present

## 2016-04-02 DIAGNOSIS — Z23 Encounter for immunization: Secondary | ICD-10-CM | POA: Diagnosis not present

## 2016-04-02 DIAGNOSIS — J321 Chronic frontal sinusitis: Secondary | ICD-10-CM | POA: Diagnosis not present

## 2016-04-02 DIAGNOSIS — F419 Anxiety disorder, unspecified: Secondary | ICD-10-CM | POA: Diagnosis not present

## 2016-04-02 DIAGNOSIS — G479 Sleep disorder, unspecified: Secondary | ICD-10-CM | POA: Diagnosis not present

## 2016-04-02 DIAGNOSIS — Z86010 Personal history of colonic polyps: Secondary | ICD-10-CM | POA: Diagnosis not present

## 2016-04-02 DIAGNOSIS — E785 Hyperlipidemia, unspecified: Secondary | ICD-10-CM | POA: Diagnosis not present

## 2016-04-02 DIAGNOSIS — E782 Mixed hyperlipidemia: Secondary | ICD-10-CM | POA: Diagnosis not present

## 2016-04-02 DIAGNOSIS — Z0000 Encounter for general adult medical examination without abnormal findings: Secondary | ICD-10-CM | POA: Diagnosis not present

## 2016-04-02 DIAGNOSIS — E119 Type 2 diabetes mellitus without complications: Secondary | ICD-10-CM | POA: Diagnosis not present

## 2016-06-08 DIAGNOSIS — Z6830 Body mass index (BMI) 30.0-30.9, adult: Secondary | ICD-10-CM | POA: Diagnosis not present

## 2016-06-08 DIAGNOSIS — M50222 Other cervical disc displacement at C5-C6 level: Secondary | ICD-10-CM | POA: Diagnosis not present

## 2016-06-08 DIAGNOSIS — R030 Elevated blood-pressure reading, without diagnosis of hypertension: Secondary | ICD-10-CM | POA: Diagnosis not present

## 2016-06-09 DIAGNOSIS — M5416 Radiculopathy, lumbar region: Secondary | ICD-10-CM | POA: Diagnosis not present

## 2016-06-16 DIAGNOSIS — D2261 Melanocytic nevi of right upper limb, including shoulder: Secondary | ICD-10-CM | POA: Diagnosis not present

## 2016-06-16 DIAGNOSIS — D2271 Melanocytic nevi of right lower limb, including hip: Secondary | ICD-10-CM | POA: Diagnosis not present

## 2016-06-16 DIAGNOSIS — L720 Epidermal cyst: Secondary | ICD-10-CM | POA: Diagnosis not present

## 2016-06-16 DIAGNOSIS — L814 Other melanin hyperpigmentation: Secondary | ICD-10-CM | POA: Diagnosis not present

## 2016-06-16 DIAGNOSIS — D2272 Melanocytic nevi of left lower limb, including hip: Secondary | ICD-10-CM | POA: Diagnosis not present

## 2016-06-16 DIAGNOSIS — L821 Other seborrheic keratosis: Secondary | ICD-10-CM | POA: Diagnosis not present

## 2016-06-16 DIAGNOSIS — D0471 Carcinoma in situ of skin of right lower limb, including hip: Secondary | ICD-10-CM | POA: Diagnosis not present

## 2016-06-16 DIAGNOSIS — D2262 Melanocytic nevi of left upper limb, including shoulder: Secondary | ICD-10-CM | POA: Diagnosis not present

## 2016-06-28 DIAGNOSIS — F1120 Opioid dependence, uncomplicated: Secondary | ICD-10-CM | POA: Diagnosis not present

## 2016-06-28 DIAGNOSIS — Z79899 Other long term (current) drug therapy: Secondary | ICD-10-CM | POA: Diagnosis not present

## 2016-06-28 DIAGNOSIS — M47816 Spondylosis without myelopathy or radiculopathy, lumbar region: Secondary | ICD-10-CM | POA: Diagnosis not present

## 2016-06-28 DIAGNOSIS — M5416 Radiculopathy, lumbar region: Secondary | ICD-10-CM | POA: Diagnosis not present

## 2016-06-30 DIAGNOSIS — B0089 Other herpesviral infection: Secondary | ICD-10-CM | POA: Diagnosis not present

## 2016-06-30 DIAGNOSIS — D0471 Carcinoma in situ of skin of right lower limb, including hip: Secondary | ICD-10-CM | POA: Diagnosis not present

## 2016-07-08 DIAGNOSIS — L821 Other seborrheic keratosis: Secondary | ICD-10-CM | POA: Diagnosis not present

## 2016-07-08 DIAGNOSIS — Z85828 Personal history of other malignant neoplasm of skin: Secondary | ICD-10-CM | POA: Diagnosis not present

## 2016-07-08 DIAGNOSIS — L218 Other seborrheic dermatitis: Secondary | ICD-10-CM | POA: Diagnosis not present

## 2016-07-08 DIAGNOSIS — D225 Melanocytic nevi of trunk: Secondary | ICD-10-CM | POA: Diagnosis not present

## 2016-07-08 DIAGNOSIS — D1801 Hemangioma of skin and subcutaneous tissue: Secondary | ICD-10-CM | POA: Diagnosis not present

## 2016-07-19 DIAGNOSIS — M50222 Other cervical disc displacement at C5-C6 level: Secondary | ICD-10-CM | POA: Diagnosis not present

## 2016-07-19 DIAGNOSIS — M5412 Radiculopathy, cervical region: Secondary | ICD-10-CM | POA: Diagnosis not present

## 2016-07-20 ENCOUNTER — Other Ambulatory Visit: Payer: Self-pay | Admitting: Neurosurgery

## 2016-07-20 DIAGNOSIS — M5412 Radiculopathy, cervical region: Secondary | ICD-10-CM

## 2016-07-29 ENCOUNTER — Ambulatory Visit
Admission: RE | Admit: 2016-07-29 | Discharge: 2016-07-29 | Disposition: A | Discharge status: Home / Self Care | Payer: PPO | Source: Ambulatory Visit | Attending: Neurosurgery | Admitting: Neurosurgery

## 2016-07-29 DIAGNOSIS — M5412 Radiculopathy, cervical region: Secondary | ICD-10-CM

## 2016-07-29 DIAGNOSIS — M542 Cervicalgia: Secondary | ICD-10-CM | POA: Diagnosis not present

## 2016-08-09 DIAGNOSIS — M5412 Radiculopathy, cervical region: Secondary | ICD-10-CM | POA: Diagnosis not present

## 2016-08-09 DIAGNOSIS — Z6830 Body mass index (BMI) 30.0-30.9, adult: Secondary | ICD-10-CM | POA: Diagnosis not present

## 2016-09-01 DIAGNOSIS — M542 Cervicalgia: Secondary | ICD-10-CM | POA: Diagnosis not present

## 2016-09-01 DIAGNOSIS — M791 Myalgia: Secondary | ICD-10-CM | POA: Diagnosis not present

## 2016-09-14 DIAGNOSIS — M47816 Spondylosis without myelopathy or radiculopathy, lumbar region: Secondary | ICD-10-CM | POA: Diagnosis not present

## 2016-09-14 DIAGNOSIS — I10 Essential (primary) hypertension: Secondary | ICD-10-CM | POA: Diagnosis not present

## 2016-09-14 DIAGNOSIS — Z6830 Body mass index (BMI) 30.0-30.9, adult: Secondary | ICD-10-CM | POA: Diagnosis not present

## 2016-10-26 DIAGNOSIS — J301 Allergic rhinitis due to pollen: Secondary | ICD-10-CM | POA: Diagnosis not present

## 2016-10-26 DIAGNOSIS — R05 Cough: Secondary | ICD-10-CM | POA: Diagnosis not present

## 2016-10-26 DIAGNOSIS — J0100 Acute maxillary sinusitis, unspecified: Secondary | ICD-10-CM | POA: Diagnosis not present

## 2016-11-01 DIAGNOSIS — M542 Cervicalgia: Secondary | ICD-10-CM | POA: Diagnosis not present

## 2016-11-01 DIAGNOSIS — M47816 Spondylosis without myelopathy or radiculopathy, lumbar region: Secondary | ICD-10-CM | POA: Diagnosis not present

## 2016-11-01 DIAGNOSIS — M791 Myalgia: Secondary | ICD-10-CM | POA: Diagnosis not present

## 2016-11-01 DIAGNOSIS — M5412 Radiculopathy, cervical region: Secondary | ICD-10-CM | POA: Diagnosis not present

## 2016-11-12 DIAGNOSIS — Z1231 Encounter for screening mammogram for malignant neoplasm of breast: Secondary | ICD-10-CM | POA: Diagnosis not present

## 2016-11-12 DIAGNOSIS — F419 Anxiety disorder, unspecified: Secondary | ICD-10-CM | POA: Diagnosis not present

## 2016-11-12 DIAGNOSIS — E119 Type 2 diabetes mellitus without complications: Secondary | ICD-10-CM | POA: Diagnosis not present

## 2016-11-12 DIAGNOSIS — E782 Mixed hyperlipidemia: Secondary | ICD-10-CM | POA: Diagnosis not present

## 2016-11-12 DIAGNOSIS — E7800 Pure hypercholesterolemia, unspecified: Secondary | ICD-10-CM | POA: Diagnosis not present

## 2016-12-02 ENCOUNTER — Other Ambulatory Visit: Payer: Self-pay | Admitting: Internal Medicine

## 2016-12-02 DIAGNOSIS — Z1231 Encounter for screening mammogram for malignant neoplasm of breast: Secondary | ICD-10-CM

## 2016-12-08 ENCOUNTER — Ambulatory Visit
Admission: RE | Admit: 2016-12-08 | Discharge: 2016-12-08 | Disposition: A | Discharge status: Home / Self Care | Payer: PPO | Source: Ambulatory Visit | Attending: Internal Medicine | Admitting: Internal Medicine

## 2016-12-08 DIAGNOSIS — Z1231 Encounter for screening mammogram for malignant neoplasm of breast: Secondary | ICD-10-CM

## 2016-12-16 DIAGNOSIS — H04123 Dry eye syndrome of bilateral lacrimal glands: Secondary | ICD-10-CM | POA: Diagnosis not present

## 2016-12-16 DIAGNOSIS — H25813 Combined forms of age-related cataract, bilateral: Secondary | ICD-10-CM | POA: Diagnosis not present

## 2016-12-16 DIAGNOSIS — E119 Type 2 diabetes mellitus without complications: Secondary | ICD-10-CM | POA: Diagnosis not present

## 2016-12-27 DIAGNOSIS — H2512 Age-related nuclear cataract, left eye: Secondary | ICD-10-CM | POA: Diagnosis not present

## 2017-01-04 DIAGNOSIS — H2511 Age-related nuclear cataract, right eye: Secondary | ICD-10-CM | POA: Diagnosis not present

## 2017-01-10 DIAGNOSIS — H2511 Age-related nuclear cataract, right eye: Secondary | ICD-10-CM | POA: Diagnosis not present

## 2017-01-10 HISTORY — PX: CATARACT EXTRACTION: SUR2

## 2017-01-11 ENCOUNTER — Emergency Department (HOSPITAL_COMMUNITY): Payer: PPO

## 2017-01-11 ENCOUNTER — Observation Stay (HOSPITAL_COMMUNITY)
Admission: EM | Admit: 2017-01-11 | Discharge: 2017-01-12 | Disposition: A | Discharge status: Home / Self Care | Payer: PPO | Attending: Internal Medicine | Admitting: Internal Medicine

## 2017-01-11 ENCOUNTER — Encounter (HOSPITAL_COMMUNITY): Payer: Self-pay

## 2017-01-11 DIAGNOSIS — K529 Noninfective gastroenteritis and colitis, unspecified: Secondary | ICD-10-CM | POA: Diagnosis not present

## 2017-01-11 DIAGNOSIS — Z79899 Other long term (current) drug therapy: Secondary | ICD-10-CM | POA: Insufficient documentation

## 2017-01-11 DIAGNOSIS — E119 Type 2 diabetes mellitus without complications: Secondary | ICD-10-CM | POA: Insufficient documentation

## 2017-01-11 DIAGNOSIS — R112 Nausea with vomiting, unspecified: Secondary | ICD-10-CM

## 2017-01-11 DIAGNOSIS — E118 Type 2 diabetes mellitus with unspecified complications: Secondary | ICD-10-CM | POA: Diagnosis not present

## 2017-01-11 DIAGNOSIS — G8929 Other chronic pain: Secondary | ICD-10-CM | POA: Insufficient documentation

## 2017-01-11 DIAGNOSIS — K219 Gastro-esophageal reflux disease without esophagitis: Secondary | ICD-10-CM

## 2017-01-11 DIAGNOSIS — R197 Diarrhea, unspecified: Secondary | ICD-10-CM | POA: Diagnosis not present

## 2017-01-11 DIAGNOSIS — Z9049 Acquired absence of other specified parts of digestive tract: Secondary | ICD-10-CM | POA: Diagnosis not present

## 2017-01-11 DIAGNOSIS — A09 Infectious gastroenteritis and colitis, unspecified: Principal | ICD-10-CM

## 2017-01-11 DIAGNOSIS — F418 Other specified anxiety disorders: Secondary | ICD-10-CM | POA: Insufficient documentation

## 2017-01-11 DIAGNOSIS — K922 Gastrointestinal hemorrhage, unspecified: Secondary | ICD-10-CM

## 2017-01-11 DIAGNOSIS — G894 Chronic pain syndrome: Secondary | ICD-10-CM

## 2017-01-11 DIAGNOSIS — Z8673 Personal history of transient ischemic attack (TIA), and cerebral infarction without residual deficits: Secondary | ICD-10-CM | POA: Insufficient documentation

## 2017-01-11 DIAGNOSIS — R109 Unspecified abdominal pain: Secondary | ICD-10-CM | POA: Diagnosis not present

## 2017-01-11 HISTORY — DX: Noninfective gastroenteritis and colitis, unspecified: K52.9

## 2017-01-11 LAB — TYPE AND SCREEN
ABO/RH(D): A POS
Antibody Screen: NEGATIVE

## 2017-01-11 LAB — SEDIMENTATION RATE: Sed Rate: 12 mm/hr (ref 0–22)

## 2017-01-11 LAB — CBC
HCT: 44.6 % (ref 36.0–46.0)
Hemoglobin: 14.8 g/dL (ref 12.0–15.0)
MCH: 31.4 pg (ref 26.0–34.0)
MCHC: 33.2 g/dL (ref 30.0–36.0)
MCV: 94.7 fL (ref 78.0–100.0)
Platelets: 300 10*3/uL (ref 150–400)
RBC: 4.71 MIL/uL (ref 3.87–5.11)
RDW: 12.3 % (ref 11.5–15.5)
WBC: 15.5 10*3/uL — ABNORMAL HIGH (ref 4.0–10.5)

## 2017-01-11 LAB — COMPREHENSIVE METABOLIC PANEL
ALBUMIN: 4 g/dL (ref 3.5–5.0)
ALK PHOS: 56 U/L (ref 38–126)
ALT: 19 U/L (ref 14–54)
AST: 20 U/L (ref 15–41)
Anion gap: 9 (ref 5–15)
BUN: 14 mg/dL (ref 6–20)
CO2: 24 mmol/L (ref 22–32)
CREATININE: 0.95 mg/dL (ref 0.44–1.00)
Calcium: 9.2 mg/dL (ref 8.9–10.3)
Chloride: 103 mmol/L (ref 101–111)
GFR calc Af Amer: >60 mL/min (ref >60)
GFR calc non Af Amer: 56 mL/min — ABNORMAL LOW (ref >60)
GLUCOSE: 170 mg/dL — AB (ref 65–99)
Potassium: 4.3 mmol/L (ref 3.5–5.1)
SODIUM: 136 mmol/L (ref 135–145)
Total Bilirubin: 1 mg/dL (ref 0.3–1.2)
Total Protein: 6.6 g/dL (ref 6.5–8.1)

## 2017-01-11 LAB — POC OCCULT BLOOD, ED: Fecal Occult Bld: POSITIVE — AB

## 2017-01-11 LAB — GLUCOSE, CAPILLARY: GLUCOSE-CAPILLARY: 106 mg/dL — AB (ref 65–99)

## 2017-01-11 MED ORDER — INSULIN ASPART 100 UNIT/ML ~~LOC~~ SOLN: 0.0000 [IU] | Freq: Every day | SUBCUTANEOUS | Status: DC

## 2017-01-11 MED ORDER — ONDANSETRON HCL 4 MG PO TABS
4.0000 mg | ORAL_TABLET | Freq: Four times a day (QID) | ORAL | Status: DC | PRN
  Administered 2017-01-12: 4 mg via ORAL
  Filled 2017-01-11: qty 1

## 2017-01-11 MED ORDER — ONDANSETRON HCL 4 MG/2ML IJ SOLN
4.0000 mg | Freq: Once | INTRAMUSCULAR | Status: AC
  Administered 2017-01-11: 4 mg via INTRAVENOUS
  Filled 2017-01-11: qty 2

## 2017-01-11 MED ORDER — FENTANYL CITRATE (PF) 100 MCG/2ML IJ SOLN: 50.0000 ug | INTRAMUSCULAR | Status: DC | PRN

## 2017-01-11 MED ORDER — ACETAMINOPHEN 325 MG PO TABS: 650.0000 mg | ORAL_TABLET | Freq: Four times a day (QID) | ORAL | Status: DC | PRN

## 2017-01-11 MED ORDER — GABAPENTIN 300 MG PO CAPS
300.0000 mg | ORAL_CAPSULE | Freq: Two times a day (BID) | ORAL | Status: DC
  Administered 2017-01-11 – 2017-01-12 (×2): 300 mg via ORAL
  Filled 2017-01-11 (×2): qty 1

## 2017-01-11 MED ORDER — SODIUM CHLORIDE 0.9 % IV SOLN
Freq: Once | INTRAVENOUS | Status: AC
  Administered 2017-01-11: 125 mL/h via INTRAVENOUS

## 2017-01-11 MED ORDER — PANTOPRAZOLE SODIUM 40 MG IV SOLR
40.0000 mg | Freq: Once | INTRAVENOUS | Status: AC
  Administered 2017-01-11: 40 mg via INTRAVENOUS
  Filled 2017-01-11: qty 40

## 2017-01-11 MED ORDER — LEVOFLOXACIN IN D5W 500 MG/100ML IV SOLN
500.0000 mg | INTRAVENOUS | Status: DC
  Administered 2017-01-11: 500 mg via INTRAVENOUS
  Filled 2017-01-11 (×2): qty 100

## 2017-01-11 MED ORDER — ONDANSETRON HCL 4 MG/2ML IJ SOLN: 4.0000 mg | Freq: Four times a day (QID) | INTRAMUSCULAR | Status: DC | PRN

## 2017-01-11 MED ORDER — METRONIDAZOLE IN NACL 5-0.79 MG/ML-% IV SOLN
500.0000 mg | Freq: Three times a day (TID) | INTRAVENOUS | Status: DC
  Administered 2017-01-11 – 2017-01-12 (×3): 500 mg via INTRAVENOUS
  Filled 2017-01-11 (×3): qty 100

## 2017-01-11 MED ORDER — LORAZEPAM 1 MG PO TABS
1.0000 mg | ORAL_TABLET | Freq: Every day | ORAL | Status: DC | PRN
  Administered 2017-01-12: 1 mg via ORAL
  Filled 2017-01-11: qty 1

## 2017-01-11 MED ORDER — MORPHINE SULFATE (PF) 4 MG/ML IV SOLN
4.0000 mg | Freq: Once | INTRAVENOUS | Status: AC
  Administered 2017-01-11: 4 mg via INTRAVENOUS
  Filled 2017-01-11: qty 1

## 2017-01-11 MED ORDER — IOPAMIDOL (ISOVUE-300) INJECTION 61%
INTRAVENOUS | Status: AC
  Administered 2017-01-11: 100 mL
  Filled 2017-01-11: qty 100

## 2017-01-11 MED ORDER — ACETAMINOPHEN 650 MG RE SUPP: 650.0000 mg | Freq: Four times a day (QID) | RECTAL | Status: DC | PRN

## 2017-01-11 MED ORDER — SODIUM CHLORIDE 0.9 % IV BOLUS (SEPSIS)
1000.0000 mL | Freq: Once | INTRAVENOUS | Status: AC
  Administered 2017-01-11: 1000 mL via INTRAVENOUS

## 2017-01-11 MED ORDER — LEVOFLOXACIN IN D5W 500 MG/100ML IV SOLN
500.0000 mg | Freq: Once | INTRAVENOUS | Status: AC
Start: 2017-01-11 — End: 2017-01-11
  Administered 2017-01-11: 500 mg via INTRAVENOUS
  Filled 2017-01-11: qty 100

## 2017-01-11 MED ORDER — TIZANIDINE HCL 4 MG PO TABS: 4.0000 mg | ORAL_TABLET | Freq: Four times a day (QID) | ORAL | Status: DC | PRN

## 2017-01-11 MED ORDER — ENOXAPARIN SODIUM 40 MG/0.4ML ~~LOC~~ SOLN
40.0000 mg | SUBCUTANEOUS | Status: DC
  Administered 2017-01-11: 40 mg via SUBCUTANEOUS
  Filled 2017-01-11: qty 0.4

## 2017-01-11 MED ORDER — PANTOPRAZOLE SODIUM 40 MG PO TBEC
40.0000 mg | DELAYED_RELEASE_TABLET | Freq: Every day | ORAL | Status: DC
  Administered 2017-01-11 – 2017-01-12 (×2): 40 mg via ORAL
  Filled 2017-01-11 (×2): qty 1

## 2017-01-11 MED ORDER — PROMETHAZINE HCL 25 MG/ML IJ SOLN: 12.5000 mg | Freq: Three times a day (TID) | INTRAMUSCULAR | Status: DC | PRN

## 2017-01-11 MED ORDER — FLUOXETINE HCL 20 MG PO CAPS
20.0000 mg | ORAL_CAPSULE | Freq: Every day | ORAL | Status: DC
  Administered 2017-01-11 – 2017-01-12 (×2): 20 mg via ORAL
  Filled 2017-01-11 (×3): qty 1

## 2017-01-11 MED ORDER — BUPROPION HCL ER (XL) 150 MG PO TB24
150.0000 mg | ORAL_TABLET | Freq: Every morning | ORAL | Status: DC
  Administered 2017-01-12: 150 mg via ORAL
  Filled 2017-01-11: qty 1

## 2017-01-11 MED ORDER — INSULIN ASPART 100 UNIT/ML ~~LOC~~ SOLN
0.0000 [IU] | Freq: Three times a day (TID) | SUBCUTANEOUS | Status: DC
  Administered 2017-01-12: 1 [IU] via SUBCUTANEOUS

## 2017-01-11 MED ORDER — OXYCODONE-ACETAMINOPHEN 7.5-325 MG PO TABS: 1.0000 | ORAL_TABLET | Freq: Four times a day (QID) | ORAL | Status: DC | PRN

## 2017-01-11 MED ORDER — SODIUM CHLORIDE 0.45 % IV SOLN
INTRAVENOUS | Status: DC
  Administered 2017-01-11: 1000 mL via INTRAVENOUS

## 2017-01-11 MED ORDER — METRONIDAZOLE IN NACL 5-0.79 MG/ML-% IV SOLN
500.0000 mg | Freq: Once | INTRAVENOUS | Status: AC
  Administered 2017-01-11: 500 mg via INTRAVENOUS
  Filled 2017-01-11: qty 100

## 2017-01-11 NOTE — ED Triage Notes (Signed)
Per Pt, Pt is coming from home with complaints of N/V/D with blood noted after a cataract surgery yesterday. Pt reports going home after surgery, eating lunch, and then starting to have the N/V/D. Pt reports some lower back pain and abdominal pain associated with the symptoms.

## 2017-01-11 NOTE — ED Notes (Signed)
States she had temp of 99.6 last night and this morning.  Complaining of N/V/D.  Also complaining of bright red blood in stool.

## 2017-01-11 NOTE — Progress Notes (Signed)
Patient trasfered from ED to 5W06 via stretcher; alert and oriented x 4; no complaints of pain; IV in LFA running  fluids at 125cc/hr. Orient patient to room and unit; gave patient care guide; instructed how to use the call bell and  fall risk precautions. Will continue to monitor the patient.

## 2017-01-11 NOTE — ED Notes (Signed)
Pt to CT

## 2017-01-11 NOTE — H&P (Signed)
History and Physical    Debbie Montoya XIP:[id number] DOB: 11-19-1935 DOA: 01/11/2017  PCP: Seward Carol, MD Patient coming from: Home  Chief Complaint: Abd pain n/v  HPI: Debbie Montoya is a 81 y.o. female with medical history significant of GERD, pancreatitis, CVA, diabetes.  Patient reports Abd pain that started 1 day ago. Prior to noset of sx pt was in her normal state of health until she developed fairly acute onset diarrhea, nausea and vomiting. Patient reports 5 episodes overnight of both emesis and diarrhea. Latter bowel movements are much more red/bloody. Associated with abdominal discomfort described as cramping and sharp in nature, lightheadedness, dizziness. Denies fevers, chest pain, shortness of breath, palpitations, dysuria, frequency, flank pain, neck stiffness, headache, LOC. No history of similar abdominal symptoms. Patient does endorse having cataract surgery one day ago after which she and her husband went and had [facility] to endoscopies. Symptoms started shortly thereafter.   ED Course: Objective findings outlined below.  Review of Systems: As per HPI otherwise all other systems reviewed and are negative  Ambulatory Status: No retractions  Past Medical History:  Diagnosis Date  . Anxiety   . Arthritis    lumbar spondylosis  . Colitis   . Diabetes mellitus    diet controlled  . Fever blister   . GERD (gastroesophageal reflux disease)   . Neuromuscular disorder ([place])    nerve pain on L leg from back   . Pancreatitis    1980's, pt. reports that she was hospitalized for 3 weeks, [facility]  . PONV (postoperative nausea and vomiting)    on previous back surgery, didn't last long  . Stroke [facility])    2012- seen in ER for supposed , everything checked out OK     Past Surgical History:  Procedure Laterality Date  . ABDOMINAL HYSTERECTOMY    . ANTERIOR CERVICAL DECOMP/DISCECTOMY FUSION N/A 11/18/2015   Procedure: C5-6 C6-7 Anterior cervical  decompression/diskectomy/fusion;  Surgeon: Ashok Pall, MD;  Location: [facility];  Service: Neurosurgery;  Laterality: N/A;  C5-6 C6-7 Anterior cervical decompression/diskectomy/fusion  . APPENDECTOMY    . BACK SURGERY    . CATARACT EXTRACTION  01/10/2017  . CHOLECYSTECTOMY    . COLONOSCOPY WITH PROPOFOL N/A 04/02/2014   Procedure: COLONOSCOPY WITH PROPOFOL;  Surgeon: Garlan Fair, MD;  Location: [facility];  Service: Endoscopy;  Laterality: N/A;  . DILATION AND CURETTAGE OF UTERUS    . TONSILLECTOMY      Social History   Social History  . Marital status: Married    Spouse name: N/A  . Number of children: N/A  . Years of education: N/A   Occupational History  . Not on file.   Social History Main Topics  . Smoking status: Never Smoker  . Smokeless tobacco: Never Used  . Alcohol use Yes     Comment: social- occas.  . Drug use: No  . Sexual activity: Not on file   Other Topics Concern  . Not on file   Social History Narrative  . No narrative on file    Allergies  Allergen Reactions  . Tape Other (See Comments)    Tender skin  . Penicillins Swelling and Rash    Has patient had a PCN reaction causing immediate rash, facial/tongue/throat swelling, SOB or lightheadedness with hypotension: Unknown Has patient had a PCN reaction causing severe rash involving mucus membranes or skin necrosis: No Has patient had a PCN reaction that required hospitalization No Has patient had a PCN reaction occurring  within the last 10 years: No If all of the above answers are "NO", then may proceed with Cephalosporin use.     Family History  Problem Relation Age of Onset  . Cancer Father        prostate  . COPD Mother       Prior to Admission medications   Medication Sig Start Date End Date Taking? Authorizing Provider  buPROPion (WELLBUTRIN XL) 150 MG 24 hr tablet Take 150 mg by mouth every morning.    Yes [provider]  fenofibrate 160 MG tablet Take 160 mg by  mouth daily before breakfast.  02/10/12  Yes [provider]  FLUoxetine (PROZAC) 20 MG tablet Take 20 mg by mouth daily.    Yes [provider]  gabapentin (NEURONTIN) 300 MG capsule Take 300 mg by mouth 2 (two) times daily.  03/02/12  Yes [provider]  LORazepam (ATIVAN) 1 MG tablet Take 1 mg by mouth daily as needed for anxiety.    Yes [provider]  omeprazole (PRILOSEC) 20 MG capsule Take 20 mg by mouth daily before breakfast.    Yes [provider]  tiZANidine (ZANAFLEX) 4 MG tablet Take 1 tablet (4 mg total) by mouth every 6 (six) hours as needed for muscle spasms. 11/19/15  Yes Ashok Pall, MD  valACYclovir (VALTREX) 1000 MG tablet Take 1,000 mg by mouth 2 (two) times daily as needed (for fever blisters). Patient takes two pills at first sign of fever blister and then two more tablets 12hours later.   Yes [provider]  Biotin 5000 MCG CAPS Take 5,000 mcg by mouth daily.    [provider]  HYDROcodone-acetaminophen (NORCO) 7.5-325 MG tablet Take 1 tablet by mouth every 6 (six) hours as needed for pain. 12/06/16   [provider]  ondansetron (ZOFRAN ODT) 8 MG disintegrating tablet Take 1 tablet (8 mg total) by mouth every 8 (eight) hours as needed for nausea or vomiting. Patient not taking: Reported on 01/11/2017 02/11/15   Molpus, Jenny Reichmann, MD  oxyCODONE-acetaminophen (PERCOCET) 7.5-325 MG tablet Take 1 tablet by mouth every 6 (six) hours as needed for moderate pain.  11/03/15   [provider]  oxyCODONE-acetaminophen (PERCOCET/ROXICET) 5-325 MG tablet Take 1 tablet by mouth every 6 (six) hours as needed for moderate pain. Patient not taking: Reported on 01/11/2017 11/19/15   Ashok Pall, MD    Physical Exam: Vitals:   01/11/17 1515 01/11/17 1530 01/11/17 1545 01/11/17 1626  BP: 109/60 (!) 126/59 112/60 (!) 120/54  Pulse: 77 78 77 73  Resp: _0 Temp:      TempSrc:      SpO2: 97% 96% 93% 94%    Weight:      Height:         General:  Appears calm and comfortable Eyes:  EOMI, normal lids, ENT:  grossly normal hearing, lips & tongue, mmm Neck:  no LAD, masses or thyromegaly Cardiovascular:  RRR, no m/r/g. No LE edema.  Respiratory:  CTA bilaterally, no w/r/r. Normal respiratory effort. Abdomen:  soft, ntnd, NABS Skin:  no rash or induration seen on limited exam Musculoskeletal:  grossly normal tone BUE/BLE, good ROM, no bony abnormality Psychiatric:  grossly normal mood and affect, speech fluent and appropriate, AOx3 Neurologic:  CN 2-12 grossly intact, moves all extremities in coordinated fashion, sensation intact  Labs on Admission: I have personally reviewed following labs and imaging studies  CBC:  Recent Labs Lab 01/11/17 1100  WBC 15.5*  HGB 14.8  HCT 44.6  MCV 94.7  PLT 500   Basic Metabolic Panel:  Recent Labs Lab 01/11/17 1100  NA 136  K 4.3  CL 103  CO2 24  GLUCOSE 170*  BUN 14  CREATININE 0.95  CALCIUM 9.2   GFR: Estimated Creatinine Clearance: 50.3 mL/min (by C-G formula based on SCr of 0.95 mg/dL). Liver Function Tests:  Recent Labs Lab 01/11/17 1100  AST 20  ALT 19  ALKPHOS 56  BILITOT 1.0  PROT 6.6  ALBUMIN 4.0   No results for input(s): LIPASE, AMYLASE in the last 168 hours. No results for input(s): AMMONIA in the last 168 hours. Coagulation Profile: No results for input(s): INR, PROTIME in the last 168 hours. Cardiac Enzymes: No results for input(s): CKTOTAL, CKMB, CKMBINDEX, TROPONINI in the last 168 hours. BNP (last 3 results) No results for input(s): PROBNP in the last 8760 hours. HbA1C: No results for input(s): HGBA1C in the last 72 hours. CBG: No results for input(s): GLUCAP in the last 168 hours. Lipid Profile: No results for input(s): CHOL, HDL, LDLCALC, TRIG, CHOLHDL, LDLDIRECT in the last 72 hours. Thyroid Function Tests: No results for input(s): TSH, T4TOTAL, FREET4, T3FREE, THYROIDAB in the last 72  hours. Anemia Panel: No results for input(s): VITAMINB12, FOLATE, FERRITIN, TIBC, IRON, RETICCTPCT in the last 72 hours. Urine analysis:    Component Value Date/Time   COLORURINE YELLOW 02/10/2015 2336   APPEARANCEUR CLEAR 02/10/2015 2336   LABSPEC 1.017 02/10/2015 2336   PHURINE 8.0 02/10/2015 2336   GLUCOSEU NEGATIVE 02/10/2015 2336   HGBUR NEGATIVE 02/10/2015 2336   BILIRUBINUR NEGATIVE 02/10/2015 2336   KETONESUR NEGATIVE 02/10/2015 2336   PROTEINUR NEGATIVE 02/10/2015 2336   UROBILINOGEN 1.0 02/10/2015 2336   NITRITE NEGATIVE 02/10/2015 2336   LEUKOCYTESUR NEGATIVE 02/10/2015 2336    Creatinine Clearance: Estimated Creatinine Clearance: 50.3 mL/min (by C-G formula based on SCr of 0.95 mg/dL).  Sepsis Labs: _0 (procalcitonin:4,lacticidven:4) )No results found for this or any previous visit (from the past 240 hour(s)).   Radiological Exams on Admission: Ct Abdomen Pelvis W Contrast  Result Date: 01/11/2017 CLINICAL DATA:  Lower abdominal pain since yesterday with diarrhea and bloody stool. EXAM: CT ABDOMEN AND PELVIS WITH CONTRAST TECHNIQUE: Multidetector CT imaging of the abdomen and pelvis was performed using the standard protocol following bolus administration of intravenous contrast. CONTRAST:  127m ISOVUE-300 IOPAMIDOL (ISOVUE-300) INJECTION 61% COMPARISON:  02/11/2015 CT FINDINGS: Lower chest: The visualized cardiac chambers are normal in size without pericardial effusion. Lung bases demonstrate minimal bibasilar atelectasis without pneumonic consolidation, effusion or pneumothorax. Small hiatal hernia. Hepatobiliary: Status post cholecystectomy. Hepatic steatosis without space-occupying mass. No biliary dilatation. Pancreas: No pancreatic ductal dilatation or surrounding inflammatory changes. Spleen: Normal in size without focal abnormality. Adrenals/Urinary Tract: Adrenal glands are unremarkable. Kidneys are normal, without renal calculi, focal lesion, or  hydronephrosis. Bladder is unremarkable. Stomach/Bowel: Contracted stomach with normal small bowel rotation. No small bowel inflammation or obstruction. Appendectomy by report. Moderate transmural thickening with pericolonic inflammation along the descending colon consistent colitis from the level of the splenic flexure caudad to distal descending colon. No abscess, bowel obstruction or free air. Vascular/Lymphatic: Aortic atherosclerosis without aneurysm. No lymphadenopathy. Reproductive: Status post hysterectomy. No adnexal masses. Other: No abdominal wall hernia or abnormality. No abdominopelvic ascites. Musculoskeletal: L4-5 posterior fusion with interbody disc noted. Degenerative disc disease L5-S1, stable in appearance. Minimal grade 1 anterolisthesis of L4 on L5 is also stable. No acute osseous abnormality. IMPRESSION: 1. Acute transmural  thickening of the descending colon consistent colitis possibly postinfectious or postinflammatory. No bowel obstruction, free air nor abscess. 2. Hepatic steatosis. 3. Status post cholecystectomy, hysterectomy and appendectomy. 4. L4-5 posterior lumbar fusion with interbody block in place. 5. Aortic atherosclerosis. Electronically Signed   By: Ashley Royalty M.D.   On: 01/11/2017 13:26    Assessment/Plan Active Problems:   Colitis   Chronic pain   GI bleed   Diabetes mellitus with complication (HCC)   GERD (gastroesophageal reflux disease)   Depression with anxiety   Colitis: likely infectious given how quickly her sx came on.  - stool lactoferrin, stool pathogen - continue cipro/flagyl started in ED - IVF - ESR - clear liquid diet.   GI bleed: secondary to colitis. Hgb stable at 14.8. No further intervention unless Hgb drops significantly - CBC in am  DM:  - SSI  Chronic pain: - continue percocet, Zanaflex  GERD: - continue PPI  Depression/Anxiety; - continue prozac, Wellbutrin, Ativan    DVT prophylaxis: Lovenox  Code Status: full  Family  Communication: none  Disposition Plan: pending improvement in condition  Consults called: none  Admission status: observation    ,  J MD [facility]  If 7PM-7AM, please contact night-coverage [email or web address] Password [facility]  01/11/2017, 5:42 PM

## 2017-01-11 NOTE — ED Provider Notes (Signed)
[place] DEPT Provider Note   CSN: [id number] Arrival date & time: 01/11/17  1043     History   Chief Complaint Chief Complaint  Patient presents with  . GI Bleeding    HPI Debbie Montoya is a 81 y.o. female.  HPI Patient reports that she started getting vomiting and diarrhea yesterday. She has had recurrent episodes, approximate 5 of each overnight. She reports now she is just having red blood for bowel movement. She endorses some abdominal pain. Cramping and sharp in nature. Seen become more pronounced after several episodes of vomiting. She is also lightheadedness and dizziness. He denies similar history of vomiting or diarrheal illness. Patient had cataract repair done yesterday. She had been nothing by mouth prior to the surgery. Afterwards seen her husband ate [employer] puppies from a [employer]. Shortly thereafter she became sick. Husband ate the same food and had no ill effect. Past Medical History:  Diagnosis Date  . Anxiety   . Arthritis    lumbar spondylosis  . Diabetes mellitus    diet controlled  . Fever blister   . GERD (gastroesophageal reflux disease)   . Neuromuscular disorder ([place])    nerve pain on L leg from back   . Pancreatitis    1980's, pt. reports that she was hospitalized for 3 weeks, [facility]  . PONV (postoperative nausea and vomiting)    on previous back surgery, didn't last long  . Stroke [facility])    2012- seen in ER for supposed , everything checked out OK     Patient Active Problem List   Diagnosis Date Noted  . HNP (herniated nucleus pulposus), cervical 11/18/2015  . Spondylosis of lumbar joint 04/21/2012    Past Surgical History:  Procedure Laterality Date  . ABDOMINAL HYSTERECTOMY    . ANTERIOR CERVICAL DECOMP/DISCECTOMY FUSION N/A 11/18/2015   Procedure: C5-6 C6-7 Anterior cervical decompression/diskectomy/fusion;  Surgeon: Ashok Pall, MD;  Location: [place] [facility] NEURO ORS;  Service: Neurosurgery;  Laterality: N/A;  C5-6 C6-7  Anterior cervical decompression/diskectomy/fusion  . APPENDECTOMY    . BACK SURGERY    . CATARACT EXTRACTION  01/10/2017  . CHOLECYSTECTOMY    . COLONOSCOPY WITH PROPOFOL N/A 04/02/2014   Procedure: COLONOSCOPY WITH PROPOFOL;  Surgeon: Garlan Fair, MD;  Location: [facility] ENDOSCOPY;  Service: Endoscopy;  Laterality: N/A;  . DILATION AND CURETTAGE OF UTERUS    . TONSILLECTOMY      OB History    No data available       Home Medications    Prior to Admission medications   Medication Sig Start Date End Date Taking? Authorizing Provider  buPROPion (WELLBUTRIN XL) 150 MG 24 hr tablet Take 150 mg by mouth every morning.    Yes [provider]  fenofibrate 160 MG tablet Take 160 mg by mouth daily before breakfast.  02/10/12  Yes [provider]  FLUoxetine (PROZAC) 20 MG tablet Take 20 mg by mouth daily.    Yes [provider]  gabapentin (NEURONTIN) 300 MG capsule Take 300 mg by mouth 2 (two) times daily.  03/02/12  Yes [provider]  LORazepam (ATIVAN) 1 MG tablet Take 1 mg by mouth daily as needed for anxiety.    Yes [provider]  omeprazole (PRILOSEC) 20 MG capsule Take 20 mg by mouth daily before breakfast.    Yes [provider]  tiZANidine (ZANAFLEX) 4 MG tablet Take 1 tablet (4 mg total) by mouth every 6 (six) hours as needed for muscle  spasms. 11/19/15  Yes Ashok Pall, MD  valACYclovir (VALTREX) 1000 MG tablet Take 1,000 mg by mouth 2 (two) times daily as needed (for fever blisters). Patient takes two pills at first sign of fever blister and then two more tablets 12hours later.   Yes [provider]  Biotin 5000 MCG CAPS Take 5,000 mcg by mouth daily.    [provider]  HYDROcodone-acetaminophen (NORCO) 7.5-325 MG tablet Take 1 tablet by mouth every 6 (six) hours as needed for pain. 12/06/16   [provider]  ondansetron (ZOFRAN ODT) 8 MG disintegrating tablet Take 1 tablet (8 mg total) by mouth every 8  (eight) hours as needed for nausea or vomiting. Patient not taking: Reported on 01/11/2017 02/11/15   Molpus, Jenny Reichmann, MD  oxyCODONE-acetaminophen (PERCOCET) 7.5-325 MG tablet Take 1 tablet by mouth every 6 (six) hours as needed for moderate pain.  11/03/15   [provider]  oxyCODONE-acetaminophen (PERCOCET/ROXICET) 5-325 MG tablet Take 1 tablet by mouth every 6 (six) hours as needed for moderate pain. Patient not taking: Reported on 01/11/2017 11/19/15   Ashok Pall, MD    Family History Family History  Problem Relation Age of Onset  . Cancer Father        prostate  . COPD Mother     Social History Social History  Substance Use Topics  . Smoking status: Never Smoker  . Smokeless tobacco: Never Used  . Alcohol use Yes     Comment: social- occas.     Allergies   Tape and Penicillins   Review of Systems Review of Systems 10 Systems reviewed and are negative for acute change except as noted in the HPI.  Physical Exam Updated Vital Signs BP 133/70   Pulse 81   Temp 98.5 F (36.9 C) (Oral)   Resp 19   Ht 5\' 4"  (1.626 m)   Wt 81.2 kg (179 lb)   SpO2 99%   BMI 30.73 kg/m   Physical Exam  Constitutional: She is oriented to person, place, and time. She appears well-developed and well-nourished. No distress.  HENT:  Head: Normocephalic and atraumatic.  Nose: Nose normal.  Mouth/Throat: Oropharynx is clear and moist.  Eyes: Conjunctivae and EOM are normal.  Neck: Neck supple.  Cardiovascular: Normal rate, regular rhythm, normal heart sounds and intact distal pulses.   No murmur heard. Pulmonary/Chest: Effort normal and breath sounds normal. No respiratory distress.  Abdominal: Soft. There is tenderness.  Left upper mid quadrant and left lower quadrant tenderness to palpation.  Genitourinary:  Genitourinary Comments: Red blood from rectal vault. No formed stool.  Musculoskeletal: Normal range of motion. She exhibits no edema or tenderness.  Neurological: She is  alert and oriented to person, place, and time. No cranial nerve deficit. She exhibits normal muscle tone. Coordination normal.  Skin: Skin is warm and dry.  Psychiatric: She has a normal mood and affect.  Nursing note and vitals reviewed.    ED Treatments / Results  Labs (all labs ordered are listed, but only abnormal results are displayed) Labs Reviewed  COMPREHENSIVE METABOLIC PANEL - Abnormal; Notable for the following:       Result Value   Glucose, Bld 170 (*)    GFR calc non Af Amer 56 (*)    All other components within normal limits  CBC - Abnormal; Notable for the following:    WBC 15.5 (*)    All other components within normal limits  POC OCCULT BLOOD, ED - Abnormal; Notable for the  following:    Fecal Occult Bld POSITIVE (*)    All other components within normal limits  GASTROINTESTINAL PANEL BY PCR, STOOL (REPLACES STOOL CULTURE)  C DIFFICILE QUICK SCREEN W PCR REFLEX  TYPE AND SCREEN    EKG  EKG Interpretation None       Radiology Ct Abdomen Pelvis W Contrast  Result Date: 01/11/2017 CLINICAL DATA:  Lower abdominal pain since yesterday with diarrhea and bloody stool. EXAM: CT ABDOMEN AND PELVIS WITH CONTRAST TECHNIQUE: Multidetector CT imaging of the abdomen and pelvis was performed using the standard protocol following bolus administration of intravenous contrast. CONTRAST:  124mL ISOVUE-300 IOPAMIDOL (ISOVUE-300) INJECTION 61% COMPARISON:  02/11/2015 CT FINDINGS: Lower chest: The visualized cardiac chambers are normal in size without pericardial effusion. Lung bases demonstrate minimal bibasilar atelectasis without pneumonic consolidation, effusion or pneumothorax. Small hiatal hernia. Hepatobiliary: Status post cholecystectomy. Hepatic steatosis without space-occupying mass. No biliary dilatation. Pancreas: No pancreatic ductal dilatation or surrounding inflammatory changes. Spleen: Normal in size without focal abnormality. Adrenals/Urinary Tract: Adrenal glands  are unremarkable. Kidneys are normal, without renal calculi, focal lesion, or hydronephrosis. Bladder is unremarkable. Stomach/Bowel: Contracted stomach with normal small bowel rotation. No small bowel inflammation or obstruction. Appendectomy by report. Moderate transmural thickening with pericolonic inflammation along the descending colon consistent colitis from the level of the splenic flexure caudad to distal descending colon. No abscess, bowel obstruction or free air. Vascular/Lymphatic: Aortic atherosclerosis without aneurysm. No lymphadenopathy. Reproductive: Status post hysterectomy. No adnexal masses. Other: No abdominal wall hernia or abnormality. No abdominopelvic ascites. Musculoskeletal: L4-5 posterior fusion with interbody disc noted. Degenerative disc disease L5-S1, stable in appearance. Minimal grade 1 anterolisthesis of L4 on L5 is also stable. No acute osseous abnormality. IMPRESSION: 1. Acute transmural thickening of the descending colon consistent colitis possibly postinfectious or postinflammatory. No bowel obstruction, free air nor abscess. 2. Hepatic steatosis. 3. Status post cholecystectomy, hysterectomy and appendectomy. 4. L4-5 posterior lumbar fusion with interbody block in place. 5. Aortic atherosclerosis. Electronically Signed   By: Ashley Royalty M.D.   On: 01/11/2017 13:26    Procedures Procedures (including critical care time)  Medications Ordered in ED Medications  sodium chloride 0.9 % bolus 1,000 mL (0 mLs Intravenous Stopped 01/11/17 1352)  0.9 %  sodium chloride infusion (125 mL/hr Intravenous New Bag/Given 01/11/17 1245)  morphine 4 MG/ML injection 4 mg (4 mg Intravenous Given 01/11/17 1240)  ondansetron (ZOFRAN) injection 4 mg (4 mg Intravenous Given 01/11/17 1241)  pantoprazole (PROTONIX) injection 40 mg (40 mg Intravenous Given 01/11/17 1243)  iopamidol (ISOVUE-300) 61 % injection (100 mLs  Contrast Given 01/11/17 1257)  levofloxacin (LEVAQUIN) IVPB 500 mg (0 mg  Intravenous Stopped 01/11/17 1510)  metroNIDAZOLE (FLAGYL) IVPB 500 mg (0 mg Intravenous Stopped 01/11/17 1505)  morphine 4 MG/ML injection 4 mg (4 mg Intravenous Given 01/11/17 1507)     Initial Impression / Assessment and Plan / ED Course  I have reviewed the triage vital signs and the nursing notes.  Pertinent labs & imaging results that were available during my care of the patient were reviewed by me and considered in my medical decision making (see chart for details).     Recheck: (14:45) he continues to have lower left abdominal pain. Patient has taken a few sips of water but continues to feel nauseated.  Consult: [facility] hospitalist for admission. Final Clinical Impressions(s) / ED Diagnoses   Final diagnoses:  Lower GI bleed  Colitis  Infectious diarrhea  Non-intractable vomiting with nausea, unspecified vomiting type  Patient has had fairly acute onset of recurrent vomiting and diarrhea. Diarrhea is nonbloody. She does have gross blood per rectum. With acute onset, most suspect infectious etiology. CT does show colitis. Will initiate Cipro Flagyl combination. Plan to admit for continued fluid hydration, nausea and pain control. New Prescriptions New Prescriptions   No medications on file     Charlesetta Shanks, MD 01/11/17 1515

## 2017-01-11 NOTE — ED Notes (Signed)
Pt given water to drink per EDP. 

## 2017-01-11 NOTE — ED Notes (Signed)
Pt aware that stool sample needed.

## 2017-01-12 DIAGNOSIS — K529 Noninfective gastroenteritis and colitis, unspecified: Secondary | ICD-10-CM

## 2017-01-12 LAB — BASIC METABOLIC PANEL
Anion gap: 8 (ref 5–15)
BUN: 8 mg/dL (ref 6–20)
CHLORIDE: 105 mmol/L (ref 101–111)
CO2: 24 mmol/L (ref 22–32)
Calcium: 7.8 mg/dL — ABNORMAL LOW (ref 8.9–10.3)
Creatinine, Ser: 0.91 mg/dL (ref 0.44–1.00)
GFR, EST NON AFRICAN AMERICAN: 59 mL/min — AB (ref >60)
Glucose, Bld: 115 mg/dL — ABNORMAL HIGH (ref 65–99)
POTASSIUM: 4 mmol/L (ref 3.5–5.1)
SODIUM: 137 mmol/L (ref 135–145)

## 2017-01-12 LAB — CBC
HCT: 37 % (ref 36.0–46.0)
HEMOGLOBIN: 11.8 g/dL — AB (ref 12.0–15.0)
MCH: 30.9 pg (ref 26.0–34.0)
MCHC: 31.9 g/dL (ref 30.0–36.0)
MCV: 96.9 fL (ref 78.0–100.0)
PLATELETS: 231 10*3/uL (ref 150–400)
RBC: 3.82 MIL/uL — AB (ref 3.87–5.11)
RDW: 12.5 % (ref 11.5–15.5)
WBC: 10.6 10*3/uL — AB (ref 4.0–10.5)

## 2017-01-12 LAB — URINALYSIS, ROUTINE W REFLEX MICROSCOPIC
BILIRUBIN URINE: NEGATIVE
Bacteria, UA: NONE SEEN
GLUCOSE, UA: NEGATIVE mg/dL
HGB URINE DIPSTICK: NEGATIVE
Ketones, ur: NEGATIVE mg/dL
NITRITE: NEGATIVE
PROTEIN: NEGATIVE mg/dL
RBC / HPF: NONE SEEN RBC/hpf (ref 0–5)
Specific Gravity, Urine: 1.014 (ref 1.005–1.030)
pH: 5 (ref 5.0–8.0)

## 2017-01-12 LAB — GLUCOSE, CAPILLARY
GLUCOSE-CAPILLARY: 97 mg/dL (ref 65–99)
Glucose-Capillary: 132 mg/dL — ABNORMAL HIGH (ref 65–99)

## 2017-01-12 NOTE — Discharge Summary (Signed)
Physician Discharge Summary  Debbie Montoya TKZ:[id number] DOB: [birth year]  PCP: Seward Carol, MD  Admit date: 01/11/2017 Discharge date: 01/12/2017  Recommendations for Outpatient Follow-up:  1. Dr. Seward Carol, PCP in 5 days with repeat labs (CBC & BMP). Please follow hemoglobin A1c results that were sent from the hospital.  Home Health: None Equipment/Devices: None    Discharge Condition: Improved and stable  CODE STATUS: Full  Diet recommendation: Heart healthy diet.  Discharge Diagnoses:  Active Problems:   Colitis   Chronic pain   GI bleed   Diabetes mellitus with complication (HCC)   GERD (gastroesophageal reflux disease)   Depression with anxiety   Infectious diarrhea   Non-intractable vomiting with nausea   Hospital course: 81 year old female with PMH of anxiety, diet-controlled DM, fever blisters, GERD, colitis, pancreatitis, CVA, underwent a right eye glaucoma surgery on 01/10/17 and was in her usual state of health. She did not have lunch that day until about 4 PM when she and her husband had some [employer] at [facility]. Within a short period she started having multiple episodes of nausea and nonbloody emesis (5), followed by diffuse and cramping abdominal pain which was then followed by 3-4 episodes of diarrhea which later became bloody without mucus. No fever or chills. She felt slightly dizzy and lightheaded. Her husband who had the same meal did not develop any symptoms. She presented to the ED where CT abdomen revealed descending colitis. She was placed on bowel rest/clear liquids, IV fluids, empirically started on IV Cipro and Flagyl and monitored closely. Since hospital admission, she did not have any further nausea, vomiting or BM. Her abdominal pain resolved. Her diet was advanced to soft diet which she tolerated without difficulty. I discussed with infectious disease M.D. on call and we agreed that her presentation is consistent with food poisoning  and no antibiotics were recommended at discharge. Patient reports colonoscopy 3 years ago by Dr. Earle Gell and was told that given her age, she did not have to have any further colonoscopies. Her bloody diarrhea was likely related to the colitis which has since resolved. Her hemoglobin did drop from 14.8-11.8 but she is asymptomatic of this. She is advised to follow-up with her PCP early next week with repeat CBCs. CBG checks were unremarkable. PPIs continued for her GERD. She gets frequent fever blisters and takes when necessary Valtrex.  Patient was advised to seek immediate medical attention if there was any worsening of her condition or recurrence of symptoms and she verbalized understanding.  Consultations:  None  Procedures:  None   Discharge Instructions  Discharge Instructions    Call MD for:    Complete by:  As directed    Worsening diarrhea or blood in stools.   Call MD for:  difficulty breathing, headache or visual disturbances    Complete by:  As directed    Call MD for:  extreme fatigue    Complete by:  As directed    Call MD for:  persistant dizziness or light-headedness    Complete by:  As directed    Call MD for:  persistant nausea and vomiting    Complete by:  As directed    Call MD for:  severe uncontrolled pain    Complete by:  As directed    Call MD for:  temperature >100.4    Complete by:  As directed    Diet - low sodium heart healthy    Complete by:  As directed  Increase activity slowly    Complete by:  As directed        Medication List    STOP taking these medications   ondansetron 8 MG disintegrating tablet Commonly known as:  ZOFRAN ODT   oxyCODONE-acetaminophen 5-325 MG tablet Commonly known as:  PERCOCET/ROXICET   oxyCODONE-acetaminophen 7.5-325 MG tablet Commonly known as:  PERCOCET     TAKE these medications   Biotin 5000 MCG Caps Take 5,000 mcg by mouth daily.   buPROPion 150 MG 24 hr tablet Commonly known as:  WELLBUTRIN  XL Take 150 mg by mouth every morning.   fenofibrate 160 MG tablet Take 160 mg by mouth daily before breakfast.   FLUoxetine 20 MG tablet Commonly known as:  PROZAC Take 20 mg by mouth daily.   gabapentin 300 MG capsule Commonly known as:  NEURONTIN Take 300 mg by mouth 2 (two) times daily.   HYDROcodone-acetaminophen 7.5-325 MG tablet Commonly known as:  NORCO Take 1 tablet by mouth every 6 (six) hours as needed for pain.   LORazepam 1 MG tablet Commonly known as:  ATIVAN Take 1 mg by mouth daily as needed for anxiety.   omeprazole 20 MG capsule Commonly known as:  PRILOSEC Take 20 mg by mouth daily before breakfast.   tiZANidine 4 MG tablet Commonly known as:  ZANAFLEX Take 1 tablet (4 mg total) by mouth every 6 (six) hours as needed for muscle spasms.   valACYclovir 1000 MG tablet Commonly known as:  VALTREX Take 1,000 mg by mouth 2 (two) times daily as needed (for fever blisters). Patient takes two pills at first sign of fever blister and then two more tablets 12hours later.      Follow-up Information    Polite, Jori Moll, MD. Schedule an appointment as soon as possible for a visit in 5 day(s).   Specialty:  Internal Medicine Why:  To be seen with repeat labs (CBC & BMP). Contact information: [address] [place] [address] [phone]          Allergies  Allergen Reactions  . Tape Other (See Comments)    Tender skin  . Penicillins Swelling and Rash    Has patient had a PCN reaction causing immediate rash, facial/tongue/throat swelling, SOB or lightheadedness with hypotension: Unknown Has patient had a PCN reaction causing severe rash involving mucus membranes or skin necrosis: No Has patient had a PCN reaction that required hospitalization No Has patient had a PCN reaction occurring within the last 10 years: No If all of the above answers are "NO", then may proceed with Cephalosporin use.       Procedures/Studies: Ct Abdomen  Pelvis W Contrast  Result Date: 01/11/2017 CLINICAL DATA:  Lower abdominal pain since yesterday with diarrhea and bloody stool. EXAM: CT ABDOMEN AND PELVIS WITH CONTRAST TECHNIQUE: Multidetector CT imaging of the abdomen and pelvis was performed using the standard protocol following bolus administration of intravenous contrast. CONTRAST:  167mL ISOVUE-300 IOPAMIDOL (ISOVUE-300) INJECTION 61% COMPARISON:  02/11/2015 CT FINDINGS: Lower chest: The visualized cardiac chambers are normal in size without pericardial effusion. Lung bases demonstrate minimal bibasilar atelectasis without pneumonic consolidation, effusion or pneumothorax. Small hiatal hernia. Hepatobiliary: Status post cholecystectomy. Hepatic steatosis without space-occupying mass. No biliary dilatation. Pancreas: No pancreatic ductal dilatation or surrounding inflammatory changes. Spleen: Normal in size without focal abnormality. Adrenals/Urinary Tract: Adrenal glands are unremarkable. Kidneys are normal, without renal calculi, focal lesion, or hydronephrosis. Bladder is unremarkable. Stomach/Bowel: Contracted stomach with normal small bowel rotation.  No small bowel inflammation or obstruction. Appendectomy by report. Moderate transmural thickening with pericolonic inflammation along the descending colon consistent colitis from the level of the splenic flexure caudad to distal descending colon. No abscess, bowel obstruction or free air. Vascular/Lymphatic: Aortic atherosclerosis without aneurysm. No lymphadenopathy. Reproductive: Status post hysterectomy. No adnexal masses. Other: No abdominal wall hernia or abnormality. No abdominopelvic ascites. Musculoskeletal: L4-5 posterior fusion with interbody disc noted. Degenerative disc disease L5-S1, stable in appearance. Minimal grade 1 anterolisthesis of L4 on L5 is also stable. No acute osseous abnormality. IMPRESSION: 1. Acute transmural thickening of the descending colon consistent colitis possibly  postinfectious or postinflammatory. No bowel obstruction, free air nor abscess. 2. Hepatic steatosis. 3. Status post cholecystectomy, hysterectomy and appendectomy. 4. L4-5 posterior lumbar fusion with interbody block in place. 5. Aortic atherosclerosis. Electronically Signed   By: Ashley Royalty M.D.   On: 01/11/2017 13:26      Subjective: No nausea, vomiting or BM since hospital admission. Abdominal pain has resolved. Tolerated soft diet without difficulty. Reports no acute issues with recently operated right eye for cataract.  Discharge Exam:  Vitals:   01/11/17 1626 01/11/17 2130 01/12/17 0614 01/12/17 1438  BP: (!) 120/54 (!) 125/59 (!) 101/45 (!) 99/51  Pulse: 73 72 68 71  Resp: 14 18 17 18   Temp:  98.6 F (37 C) 98.6 F (37 C) 98.9 F (37.2 C)  TempSrc:      SpO2: 94%  94% 98%  Weight:      Height:        General: Pleasant elderly female, moderately built and nourished, sitting up comfortably in bed this morning. Cardiovascular: S1 & S2 heard, RRR, S1/S2 +. No murmurs, rubs, gallops or clicks. No JVD or pedal edema. Telemetry: Sinus rhythm. Respiratory: Clear to auscultation without wheezing, rhonchi or crackles. No increased work of breathing. Abdominal:  Non distended, non tender & soft. No organomegaly or masses appreciated. Normal bowel sounds heard. CNS: Alert and oriented. No focal deficits. Extremities: no edema, no cyanosis    The results of significant diagnostics from this hospitalization (including imaging, microbiology, ancillary and laboratory) are listed below for reference.     Microbiology: No results found for this or any previous visit (from the past 240 hour(s)).   Labs: CBC:  Recent Labs Lab 01/11/17 1100 01/12/17 0406  WBC 15.5* 10.6*  HGB 14.8 11.8*  HCT 44.6 37.0  MCV 94.7 96.9  PLT 300 595   Basic Metabolic Panel:  Recent Labs Lab 01/11/17 1100 01/12/17 0406  NA 136 137  K 4.3 4.0  CL 103 105  CO2 24 24  GLUCOSE 170* 115*   BUN 14 8  CREATININE 0.95 0.91  CALCIUM 9.2 7.8*   Liver Function Tests:  Recent Labs Lab 01/11/17 1100  AST 20  ALT 19  ALKPHOS 56  BILITOT 1.0  PROT 6.6  ALBUMIN 4.0   CBG:  Recent Labs Lab 01/11/17 2147 01/12/17 0822 01/12/17 1151  GLUCAP 106* 97 132*   Urinalysis    Component Value Date/Time   COLORURINE STRAW (A) 01/11/2017 0040   APPEARANCEUR CLEAR 01/11/2017 0040   LABSPEC 1.014 01/11/2017 0040   PHURINE 5.0 01/11/2017 0040   GLUCOSEU NEGATIVE 01/11/2017 0040   HGBUR NEGATIVE 01/11/2017 0040   BILIRUBINUR NEGATIVE 01/11/2017 0040   KETONESUR NEGATIVE 01/11/2017 0040   PROTEINUR NEGATIVE 01/11/2017 0040   UROBILINOGEN 1.0 02/10/2015 2336   NITRITE NEGATIVE 01/11/2017 0040   LEUKOCYTESUR TRACE (A) 01/11/2017 0040  Time coordinating discharge: Over 30 minutes  SIGNED:  Vernell Leep, MD, FACP, [place]. [facility] Pager [phone]  If 7PM-7AM, please contact night-coverage [email or web address] Password [facility] 01/12/2017, 5:21 PM

## 2017-01-13 LAB — HEMOGLOBIN A1C
Hgb A1c MFr Bld: 6.3 % — ABNORMAL HIGH (ref 4.8–5.6)
Mean Plasma Glucose: 134 mg/dL

## 2017-01-18 DIAGNOSIS — K529 Noninfective gastroenteritis and colitis, unspecified: Secondary | ICD-10-CM | POA: Diagnosis not present

## 2017-02-24 DIAGNOSIS — F1120 Opioid dependence, uncomplicated: Secondary | ICD-10-CM | POA: Diagnosis not present

## 2017-02-24 DIAGNOSIS — Z79899 Other long term (current) drug therapy: Secondary | ICD-10-CM | POA: Diagnosis not present

## 2017-02-24 DIAGNOSIS — M5412 Radiculopathy, cervical region: Secondary | ICD-10-CM | POA: Diagnosis not present

## 2017-02-24 DIAGNOSIS — M961 Postlaminectomy syndrome, not elsewhere classified: Secondary | ICD-10-CM | POA: Diagnosis not present

## 2017-03-09 DIAGNOSIS — Z961 Presence of intraocular lens: Secondary | ICD-10-CM | POA: Diagnosis not present

## 2017-04-06 DIAGNOSIS — E782 Mixed hyperlipidemia: Secondary | ICD-10-CM | POA: Diagnosis not present

## 2017-04-06 DIAGNOSIS — E119 Type 2 diabetes mellitus without complications: Secondary | ICD-10-CM | POA: Diagnosis not present

## 2017-04-06 DIAGNOSIS — E785 Hyperlipidemia, unspecified: Secondary | ICD-10-CM | POA: Diagnosis not present

## 2017-04-07 DIAGNOSIS — Z1389 Encounter for screening for other disorder: Secondary | ICD-10-CM | POA: Diagnosis not present

## 2017-04-07 DIAGNOSIS — Z0000 Encounter for general adult medical examination without abnormal findings: Secondary | ICD-10-CM | POA: Diagnosis not present

## 2017-04-07 DIAGNOSIS — G8929 Other chronic pain: Secondary | ICD-10-CM | POA: Diagnosis not present

## 2017-04-07 DIAGNOSIS — F419 Anxiety disorder, unspecified: Secondary | ICD-10-CM | POA: Diagnosis not present

## 2017-04-07 DIAGNOSIS — E119 Type 2 diabetes mellitus without complications: Secondary | ICD-10-CM | POA: Diagnosis not present

## 2017-04-07 DIAGNOSIS — R110 Nausea: Secondary | ICD-10-CM | POA: Diagnosis not present

## 2017-06-06 DIAGNOSIS — M47816 Spondylosis without myelopathy or radiculopathy, lumbar region: Secondary | ICD-10-CM | POA: Diagnosis not present

## 2017-06-06 DIAGNOSIS — M791 Myalgia, unspecified site: Secondary | ICD-10-CM | POA: Diagnosis not present

## 2017-06-06 DIAGNOSIS — M961 Postlaminectomy syndrome, not elsewhere classified: Secondary | ICD-10-CM | POA: Diagnosis not present

## 2017-06-06 DIAGNOSIS — M542 Cervicalgia: Secondary | ICD-10-CM | POA: Diagnosis not present

## 2017-06-22 DIAGNOSIS — M47816 Spondylosis without myelopathy or radiculopathy, lumbar region: Secondary | ICD-10-CM | POA: Diagnosis not present

## 2017-06-22 DIAGNOSIS — R030 Elevated blood-pressure reading, without diagnosis of hypertension: Secondary | ICD-10-CM | POA: Diagnosis not present

## 2017-06-22 DIAGNOSIS — Z6830 Body mass index (BMI) 30.0-30.9, adult: Secondary | ICD-10-CM | POA: Diagnosis not present

## 2017-06-28 DIAGNOSIS — R0981 Nasal congestion: Secondary | ICD-10-CM | POA: Diagnosis not present

## 2017-09-30 DIAGNOSIS — R6889 Other general symptoms and signs: Secondary | ICD-10-CM | POA: Diagnosis not present

## 2017-10-10 DIAGNOSIS — E119 Type 2 diabetes mellitus without complications: Secondary | ICD-10-CM | POA: Diagnosis not present

## 2017-10-10 DIAGNOSIS — E781 Pure hyperglyceridemia: Secondary | ICD-10-CM | POA: Diagnosis not present

## 2017-10-10 DIAGNOSIS — M961 Postlaminectomy syndrome, not elsewhere classified: Secondary | ICD-10-CM | POA: Diagnosis not present

## 2017-10-10 DIAGNOSIS — R05 Cough: Secondary | ICD-10-CM | POA: Diagnosis not present

## 2017-10-10 DIAGNOSIS — F419 Anxiety disorder, unspecified: Secondary | ICD-10-CM | POA: Diagnosis not present

## 2017-10-10 DIAGNOSIS — Z79899 Other long term (current) drug therapy: Secondary | ICD-10-CM | POA: Diagnosis not present

## 2017-10-10 DIAGNOSIS — E7800 Pure hypercholesterolemia, unspecified: Secondary | ICD-10-CM | POA: Diagnosis not present

## 2017-10-10 DIAGNOSIS — F1120 Opioid dependence, uncomplicated: Secondary | ICD-10-CM | POA: Diagnosis not present

## 2017-10-10 DIAGNOSIS — M47816 Spondylosis without myelopathy or radiculopathy, lumbar region: Secondary | ICD-10-CM | POA: Diagnosis not present

## 2017-11-16 ENCOUNTER — Other Ambulatory Visit: Payer: Self-pay | Admitting: Internal Medicine

## 2017-11-16 DIAGNOSIS — Z1231 Encounter for screening mammogram for malignant neoplasm of breast: Secondary | ICD-10-CM

## 2017-12-12 ENCOUNTER — Ambulatory Visit
Admission: RE | Admit: 2017-12-12 | Discharge: 2017-12-12 | Disposition: A | Discharge status: Home / Self Care | Payer: PPO | Source: Ambulatory Visit | Attending: Internal Medicine | Admitting: Internal Medicine

## 2017-12-12 DIAGNOSIS — Z1231 Encounter for screening mammogram for malignant neoplasm of breast: Secondary | ICD-10-CM

## 2018-02-01 DIAGNOSIS — M961 Postlaminectomy syndrome, not elsewhere classified: Secondary | ICD-10-CM | POA: Diagnosis not present

## 2018-02-01 DIAGNOSIS — M542 Cervicalgia: Secondary | ICD-10-CM | POA: Diagnosis not present

## 2018-02-01 DIAGNOSIS — F1120 Opioid dependence, uncomplicated: Secondary | ICD-10-CM | POA: Diagnosis not present

## 2018-02-01 DIAGNOSIS — Z79899 Other long term (current) drug therapy: Secondary | ICD-10-CM | POA: Diagnosis not present

## 2018-02-23 DIAGNOSIS — E119 Type 2 diabetes mellitus without complications: Secondary | ICD-10-CM | POA: Diagnosis not present

## 2018-02-23 DIAGNOSIS — H01024 Squamous blepharitis left upper eyelid: Secondary | ICD-10-CM | POA: Diagnosis not present

## 2018-02-23 DIAGNOSIS — H01021 Squamous blepharitis right upper eyelid: Secondary | ICD-10-CM | POA: Diagnosis not present

## 2018-02-23 DIAGNOSIS — Z961 Presence of intraocular lens: Secondary | ICD-10-CM | POA: Diagnosis not present

## 2018-02-23 DIAGNOSIS — H01022 Squamous blepharitis right lower eyelid: Secondary | ICD-10-CM | POA: Diagnosis not present

## 2018-02-23 DIAGNOSIS — H01025 Squamous blepharitis left lower eyelid: Secondary | ICD-10-CM | POA: Diagnosis not present

## 2018-04-10 DIAGNOSIS — F419 Anxiety disorder, unspecified: Secondary | ICD-10-CM | POA: Diagnosis not present

## 2018-04-10 DIAGNOSIS — Z1389 Encounter for screening for other disorder: Secondary | ICD-10-CM | POA: Diagnosis not present

## 2018-04-10 DIAGNOSIS — E782 Mixed hyperlipidemia: Secondary | ICD-10-CM | POA: Diagnosis not present

## 2018-04-10 DIAGNOSIS — E1169 Type 2 diabetes mellitus with other specified complication: Secondary | ICD-10-CM | POA: Diagnosis not present

## 2018-04-10 DIAGNOSIS — G8929 Other chronic pain: Secondary | ICD-10-CM | POA: Diagnosis not present

## 2018-04-10 DIAGNOSIS — E7800 Pure hypercholesterolemia, unspecified: Secondary | ICD-10-CM | POA: Diagnosis not present

## 2018-04-10 DIAGNOSIS — Z0000 Encounter for general adult medical examination without abnormal findings: Secondary | ICD-10-CM | POA: Diagnosis not present

## 2018-04-10 DIAGNOSIS — F331 Major depressive disorder, recurrent, moderate: Secondary | ICD-10-CM | POA: Diagnosis not present

## 2018-04-18 DIAGNOSIS — Z23 Encounter for immunization: Secondary | ICD-10-CM | POA: Diagnosis not present

## 2018-04-24 DIAGNOSIS — F1120 Opioid dependence, uncomplicated: Secondary | ICD-10-CM | POA: Diagnosis not present

## 2018-04-24 DIAGNOSIS — M961 Postlaminectomy syndrome, not elsewhere classified: Secondary | ICD-10-CM | POA: Diagnosis not present

## 2018-04-24 DIAGNOSIS — Z79899 Other long term (current) drug therapy: Secondary | ICD-10-CM | POA: Diagnosis not present

## 2018-04-24 DIAGNOSIS — M47816 Spondylosis without myelopathy or radiculopathy, lumbar region: Secondary | ICD-10-CM | POA: Diagnosis not present

## 2018-04-27 DIAGNOSIS — E782 Mixed hyperlipidemia: Secondary | ICD-10-CM | POA: Diagnosis not present

## 2018-04-27 DIAGNOSIS — E1169 Type 2 diabetes mellitus with other specified complication: Secondary | ICD-10-CM | POA: Diagnosis not present

## 2018-04-27 DIAGNOSIS — F331 Major depressive disorder, recurrent, moderate: Secondary | ICD-10-CM | POA: Diagnosis not present

## 2018-05-17 DIAGNOSIS — E785 Hyperlipidemia, unspecified: Secondary | ICD-10-CM | POA: Diagnosis not present

## 2018-05-17 DIAGNOSIS — E782 Mixed hyperlipidemia: Secondary | ICD-10-CM | POA: Diagnosis not present

## 2018-05-17 DIAGNOSIS — F331 Major depressive disorder, recurrent, moderate: Secondary | ICD-10-CM | POA: Diagnosis not present

## 2018-05-17 DIAGNOSIS — E1169 Type 2 diabetes mellitus with other specified complication: Secondary | ICD-10-CM | POA: Diagnosis not present

## 2018-05-17 DIAGNOSIS — E119 Type 2 diabetes mellitus without complications: Secondary | ICD-10-CM | POA: Diagnosis not present

## 2018-06-15 DIAGNOSIS — N898 Other specified noninflammatory disorders of vagina: Secondary | ICD-10-CM | POA: Diagnosis not present

## 2018-06-15 DIAGNOSIS — N94819 Vulvodynia, unspecified: Secondary | ICD-10-CM | POA: Diagnosis not present

## 2018-08-10 DIAGNOSIS — E1169 Type 2 diabetes mellitus with other specified complication: Secondary | ICD-10-CM | POA: Diagnosis not present

## 2018-08-10 DIAGNOSIS — E785 Hyperlipidemia, unspecified: Secondary | ICD-10-CM | POA: Diagnosis not present

## 2018-08-10 DIAGNOSIS — F331 Major depressive disorder, recurrent, moderate: Secondary | ICD-10-CM | POA: Diagnosis not present

## 2018-08-10 DIAGNOSIS — E782 Mixed hyperlipidemia: Secondary | ICD-10-CM | POA: Diagnosis not present

## 2018-08-10 DIAGNOSIS — E119 Type 2 diabetes mellitus without complications: Secondary | ICD-10-CM | POA: Diagnosis not present

## 2018-08-18 DIAGNOSIS — M542 Cervicalgia: Secondary | ICD-10-CM | POA: Diagnosis not present

## 2018-08-18 DIAGNOSIS — M5126 Other intervertebral disc displacement, lumbar region: Secondary | ICD-10-CM | POA: Diagnosis not present

## 2018-08-18 DIAGNOSIS — M5412 Radiculopathy, cervical region: Secondary | ICD-10-CM | POA: Diagnosis not present

## 2018-08-18 DIAGNOSIS — M791 Myalgia, unspecified site: Secondary | ICD-10-CM | POA: Diagnosis not present

## 2018-08-29 DIAGNOSIS — M461 Sacroiliitis, not elsewhere classified: Secondary | ICD-10-CM | POA: Diagnosis not present

## 2018-09-11 DIAGNOSIS — F331 Major depressive disorder, recurrent, moderate: Secondary | ICD-10-CM | POA: Diagnosis not present

## 2018-09-11 DIAGNOSIS — E782 Mixed hyperlipidemia: Secondary | ICD-10-CM | POA: Diagnosis not present

## 2018-09-11 DIAGNOSIS — E785 Hyperlipidemia, unspecified: Secondary | ICD-10-CM | POA: Diagnosis not present

## 2018-09-11 DIAGNOSIS — E119 Type 2 diabetes mellitus without complications: Secondary | ICD-10-CM | POA: Diagnosis not present

## 2018-09-11 DIAGNOSIS — E1169 Type 2 diabetes mellitus with other specified complication: Secondary | ICD-10-CM | POA: Diagnosis not present

## 2018-10-03 ENCOUNTER — Other Ambulatory Visit: Payer: Self-pay | Admitting: *Deleted

## 2018-10-03 DIAGNOSIS — R197 Diarrhea, unspecified: Secondary | ICD-10-CM | POA: Diagnosis not present

## 2018-10-03 NOTE — Patient Outreach (Signed)
[facility]) Care Management  10/03/2018  Debbie Montoya 12-26-1935 [id number]    Referral received 3/3 (Screening)  RN received a call back from pt concerning the purpose for today's call and RN introduced the [facility] program and services. Pt states she is doing well with no major problems however recent visit to her daughter in a hospital in [place] she has contracted what she believes is C-Diff. Pt states she has spoken with her provider's office and currently awaiting a call back for intervention on what to do. Pt states she believes she contacted it back on 2/19 with 3 days of diarrhea. States it improved until this past Sunday when to had 4 additional episodes of diarrhea and one loose stool yesterday. Pt states she has been hydrating herself but continues to have abdominal pain. Pt states no other issues at this time and completed the screening. RN offered any additional services via [facility] with no needs presented at this time for pharmacy, nursing or social work as pt communicating with her primary provider on resolving the current issues.   RN strongly encouraged pt to continue communicating with her provider on interventions and/or medication to treat this condition as soon as possible and to continue hydrating herself to avoid dehydration which can lead to hospitalization. Pt verbalized an understanding and will follow through. No additional needs, inquires or request at this time. RN offered to send brochure on [facility] services with letter for contacts to the [facility] office if needed in the future (pt receptive).   Will closed with no apparent needs at this time for disease/case management.  Raina Mina, RN Care Management Coordinator [place] Office [phone]

## 2018-10-03 NOTE — Patient Outreach (Signed)
[facility]) Care Management  10/03/2018  CATALIA MASSETT 1936/05/04 [id number]  Referral received 10/03/2018 (Screening)  RN initial outreach attempt for screening unsuccessful. RN contact both home and mobile but only able to leave a HIPAA voice message to the mobile contact. Will rescheduled another outreach call this week.  Raina Mina, RN Care Management Coordinator [place] Office [phone]

## 2018-10-05 ENCOUNTER — Ambulatory Visit: Payer: PPO | Admitting: *Deleted

## 2018-10-05 DIAGNOSIS — F331 Major depressive disorder, recurrent, moderate: Secondary | ICD-10-CM | POA: Diagnosis not present

## 2018-10-05 DIAGNOSIS — E1169 Type 2 diabetes mellitus with other specified complication: Secondary | ICD-10-CM | POA: Diagnosis not present

## 2018-10-05 DIAGNOSIS — E119 Type 2 diabetes mellitus without complications: Secondary | ICD-10-CM | POA: Diagnosis not present

## 2018-10-05 DIAGNOSIS — E785 Hyperlipidemia, unspecified: Secondary | ICD-10-CM | POA: Diagnosis not present

## 2018-10-05 DIAGNOSIS — E782 Mixed hyperlipidemia: Secondary | ICD-10-CM | POA: Diagnosis not present

## 2018-10-06 DIAGNOSIS — R197 Diarrhea, unspecified: Secondary | ICD-10-CM | POA: Diagnosis not present

## 2018-10-10 DIAGNOSIS — R197 Diarrhea, unspecified: Secondary | ICD-10-CM | POA: Diagnosis not present

## 2018-10-11 DIAGNOSIS — E1169 Type 2 diabetes mellitus with other specified complication: Secondary | ICD-10-CM | POA: Diagnosis not present

## 2018-10-11 DIAGNOSIS — E7800 Pure hypercholesterolemia, unspecified: Secondary | ICD-10-CM | POA: Diagnosis not present

## 2018-10-11 DIAGNOSIS — R252 Cramp and spasm: Secondary | ICD-10-CM | POA: Diagnosis not present

## 2018-10-11 DIAGNOSIS — F331 Major depressive disorder, recurrent, moderate: Secondary | ICD-10-CM | POA: Diagnosis not present

## 2018-11-03 DIAGNOSIS — E1169 Type 2 diabetes mellitus with other specified complication: Secondary | ICD-10-CM | POA: Diagnosis not present

## 2018-11-03 DIAGNOSIS — F331 Major depressive disorder, recurrent, moderate: Secondary | ICD-10-CM | POA: Diagnosis not present

## 2018-11-03 DIAGNOSIS — E782 Mixed hyperlipidemia: Secondary | ICD-10-CM | POA: Diagnosis not present

## 2018-12-15 DIAGNOSIS — E1169 Type 2 diabetes mellitus with other specified complication: Secondary | ICD-10-CM | POA: Diagnosis not present

## 2018-12-15 DIAGNOSIS — E7800 Pure hypercholesterolemia, unspecified: Secondary | ICD-10-CM | POA: Diagnosis not present

## 2018-12-15 DIAGNOSIS — E782 Mixed hyperlipidemia: Secondary | ICD-10-CM | POA: Diagnosis not present

## 2018-12-15 DIAGNOSIS — E785 Hyperlipidemia, unspecified: Secondary | ICD-10-CM | POA: Diagnosis not present

## 2018-12-15 DIAGNOSIS — F331 Major depressive disorder, recurrent, moderate: Secondary | ICD-10-CM | POA: Diagnosis not present

## 2018-12-15 DIAGNOSIS — E119 Type 2 diabetes mellitus without complications: Secondary | ICD-10-CM | POA: Diagnosis not present

## 2019-01-12 ENCOUNTER — Other Ambulatory Visit: Payer: Self-pay | Admitting: Internal Medicine

## 2019-01-12 DIAGNOSIS — Z1231 Encounter for screening mammogram for malignant neoplasm of breast: Secondary | ICD-10-CM

## 2019-01-25 ENCOUNTER — Other Ambulatory Visit: Payer: Self-pay

## 2019-01-25 ENCOUNTER — Ambulatory Visit
Admission: RE | Admit: 2019-01-25 | Discharge: 2019-01-25 | Disposition: A | Discharge status: Home / Self Care | Payer: PPO | Source: Ambulatory Visit | Attending: Internal Medicine | Admitting: Internal Medicine

## 2019-01-25 DIAGNOSIS — Z1231 Encounter for screening mammogram for malignant neoplasm of breast: Secondary | ICD-10-CM | POA: Diagnosis not present

## 2019-01-30 DIAGNOSIS — E785 Hyperlipidemia, unspecified: Secondary | ICD-10-CM | POA: Diagnosis not present

## 2019-01-30 DIAGNOSIS — E7800 Pure hypercholesterolemia, unspecified: Secondary | ICD-10-CM | POA: Diagnosis not present

## 2019-01-30 DIAGNOSIS — F331 Major depressive disorder, recurrent, moderate: Secondary | ICD-10-CM | POA: Diagnosis not present

## 2019-01-30 DIAGNOSIS — E1169 Type 2 diabetes mellitus with other specified complication: Secondary | ICD-10-CM | POA: Diagnosis not present

## 2019-01-30 DIAGNOSIS — E119 Type 2 diabetes mellitus without complications: Secondary | ICD-10-CM | POA: Diagnosis not present

## 2019-01-30 DIAGNOSIS — E782 Mixed hyperlipidemia: Secondary | ICD-10-CM | POA: Diagnosis not present

## 2019-02-21 ENCOUNTER — Other Ambulatory Visit: Payer: Self-pay

## 2019-02-21 DIAGNOSIS — Z20822 Contact with and (suspected) exposure to covid-19: Secondary | ICD-10-CM

## 2019-02-24 LAB — NOVEL CORONAVIRUS, NAA: SARS-CoV-2, NAA: NOT DETECTED

## 2019-02-27 DIAGNOSIS — H0102A Squamous blepharitis right eye, upper and lower eyelids: Secondary | ICD-10-CM | POA: Diagnosis not present

## 2019-02-27 DIAGNOSIS — Z961 Presence of intraocular lens: Secondary | ICD-10-CM | POA: Diagnosis not present

## 2019-02-27 DIAGNOSIS — H0102B Squamous blepharitis left eye, upper and lower eyelids: Secondary | ICD-10-CM | POA: Diagnosis not present

## 2019-02-27 DIAGNOSIS — E119 Type 2 diabetes mellitus without complications: Secondary | ICD-10-CM | POA: Diagnosis not present

## 2019-03-02 DIAGNOSIS — E119 Type 2 diabetes mellitus without complications: Secondary | ICD-10-CM | POA: Diagnosis not present

## 2019-03-02 DIAGNOSIS — F331 Major depressive disorder, recurrent, moderate: Secondary | ICD-10-CM | POA: Diagnosis not present

## 2019-03-02 DIAGNOSIS — E785 Hyperlipidemia, unspecified: Secondary | ICD-10-CM | POA: Diagnosis not present

## 2019-03-02 DIAGNOSIS — E782 Mixed hyperlipidemia: Secondary | ICD-10-CM | POA: Diagnosis not present

## 2019-03-02 DIAGNOSIS — E7800 Pure hypercholesterolemia, unspecified: Secondary | ICD-10-CM | POA: Diagnosis not present

## 2019-03-02 DIAGNOSIS — E1169 Type 2 diabetes mellitus with other specified complication: Secondary | ICD-10-CM | POA: Diagnosis not present

## 2019-03-08 DIAGNOSIS — F1120 Opioid dependence, uncomplicated: Secondary | ICD-10-CM | POA: Diagnosis not present

## 2019-03-08 DIAGNOSIS — M461 Sacroiliitis, not elsewhere classified: Secondary | ICD-10-CM | POA: Diagnosis not present

## 2019-03-08 DIAGNOSIS — Z79899 Other long term (current) drug therapy: Secondary | ICD-10-CM | POA: Diagnosis not present

## 2019-03-08 DIAGNOSIS — R030 Elevated blood-pressure reading, without diagnosis of hypertension: Secondary | ICD-10-CM | POA: Diagnosis not present

## 2019-03-21 DIAGNOSIS — R030 Elevated blood-pressure reading, without diagnosis of hypertension: Secondary | ICD-10-CM | POA: Diagnosis not present

## 2019-03-21 DIAGNOSIS — M461 Sacroiliitis, not elsewhere classified: Secondary | ICD-10-CM | POA: Diagnosis not present

## 2019-03-21 DIAGNOSIS — Z6831 Body mass index (BMI) 31.0-31.9, adult: Secondary | ICD-10-CM | POA: Diagnosis not present

## 2019-03-28 DIAGNOSIS — E785 Hyperlipidemia, unspecified: Secondary | ICD-10-CM | POA: Diagnosis not present

## 2019-03-28 DIAGNOSIS — F331 Major depressive disorder, recurrent, moderate: Secondary | ICD-10-CM | POA: Diagnosis not present

## 2019-03-28 DIAGNOSIS — E119 Type 2 diabetes mellitus without complications: Secondary | ICD-10-CM | POA: Diagnosis not present

## 2019-03-28 DIAGNOSIS — E1169 Type 2 diabetes mellitus with other specified complication: Secondary | ICD-10-CM | POA: Diagnosis not present

## 2019-03-28 DIAGNOSIS — E7800 Pure hypercholesterolemia, unspecified: Secondary | ICD-10-CM | POA: Diagnosis not present

## 2019-03-28 DIAGNOSIS — E782 Mixed hyperlipidemia: Secondary | ICD-10-CM | POA: Diagnosis not present

## 2019-05-01 DIAGNOSIS — E1169 Type 2 diabetes mellitus with other specified complication: Secondary | ICD-10-CM | POA: Diagnosis not present

## 2019-05-01 DIAGNOSIS — E785 Hyperlipidemia, unspecified: Secondary | ICD-10-CM | POA: Diagnosis not present

## 2019-05-01 DIAGNOSIS — F331 Major depressive disorder, recurrent, moderate: Secondary | ICD-10-CM | POA: Diagnosis not present

## 2019-05-01 DIAGNOSIS — E119 Type 2 diabetes mellitus without complications: Secondary | ICD-10-CM | POA: Diagnosis not present

## 2019-05-01 DIAGNOSIS — E7800 Pure hypercholesterolemia, unspecified: Secondary | ICD-10-CM | POA: Diagnosis not present

## 2019-05-01 DIAGNOSIS — E782 Mixed hyperlipidemia: Secondary | ICD-10-CM | POA: Diagnosis not present

## 2019-05-22 ENCOUNTER — Other Ambulatory Visit: Payer: Self-pay | Admitting: Internal Medicine

## 2019-05-22 DIAGNOSIS — E7800 Pure hypercholesterolemia, unspecified: Secondary | ICD-10-CM | POA: Diagnosis not present

## 2019-05-22 DIAGNOSIS — Z1389 Encounter for screening for other disorder: Secondary | ICD-10-CM | POA: Diagnosis not present

## 2019-05-22 DIAGNOSIS — E2839 Other primary ovarian failure: Secondary | ICD-10-CM

## 2019-05-22 DIAGNOSIS — F331 Major depressive disorder, recurrent, moderate: Secondary | ICD-10-CM | POA: Diagnosis not present

## 2019-05-22 DIAGNOSIS — G8929 Other chronic pain: Secondary | ICD-10-CM | POA: Diagnosis not present

## 2019-05-22 DIAGNOSIS — Z0000 Encounter for general adult medical examination without abnormal findings: Secondary | ICD-10-CM | POA: Diagnosis not present

## 2019-05-22 DIAGNOSIS — E1169 Type 2 diabetes mellitus with other specified complication: Secondary | ICD-10-CM | POA: Diagnosis not present

## 2019-05-29 DIAGNOSIS — F331 Major depressive disorder, recurrent, moderate: Secondary | ICD-10-CM | POA: Diagnosis not present

## 2019-05-29 DIAGNOSIS — E7800 Pure hypercholesterolemia, unspecified: Secondary | ICD-10-CM | POA: Diagnosis not present

## 2019-05-29 DIAGNOSIS — E785 Hyperlipidemia, unspecified: Secondary | ICD-10-CM | POA: Diagnosis not present

## 2019-05-29 DIAGNOSIS — E1169 Type 2 diabetes mellitus with other specified complication: Secondary | ICD-10-CM | POA: Diagnosis not present

## 2019-05-29 DIAGNOSIS — E782 Mixed hyperlipidemia: Secondary | ICD-10-CM | POA: Diagnosis not present

## 2019-05-29 DIAGNOSIS — E119 Type 2 diabetes mellitus without complications: Secondary | ICD-10-CM | POA: Diagnosis not present

## 2019-06-04 DIAGNOSIS — M461 Sacroiliitis, not elsewhere classified: Secondary | ICD-10-CM | POA: Diagnosis not present

## 2019-06-04 DIAGNOSIS — M961 Postlaminectomy syndrome, not elsewhere classified: Secondary | ICD-10-CM | POA: Diagnosis not present

## 2019-06-04 DIAGNOSIS — Z79899 Other long term (current) drug therapy: Secondary | ICD-10-CM | POA: Diagnosis not present

## 2019-06-04 DIAGNOSIS — M5126 Other intervertebral disc displacement, lumbar region: Secondary | ICD-10-CM | POA: Diagnosis not present

## 2019-06-19 DIAGNOSIS — F331 Major depressive disorder, recurrent, moderate: Secondary | ICD-10-CM | POA: Diagnosis not present

## 2019-06-19 DIAGNOSIS — E782 Mixed hyperlipidemia: Secondary | ICD-10-CM | POA: Diagnosis not present

## 2019-06-19 DIAGNOSIS — E785 Hyperlipidemia, unspecified: Secondary | ICD-10-CM | POA: Diagnosis not present

## 2019-06-19 DIAGNOSIS — E119 Type 2 diabetes mellitus without complications: Secondary | ICD-10-CM | POA: Diagnosis not present

## 2019-06-19 DIAGNOSIS — E1169 Type 2 diabetes mellitus with other specified complication: Secondary | ICD-10-CM | POA: Diagnosis not present

## 2019-06-19 DIAGNOSIS — E7800 Pure hypercholesterolemia, unspecified: Secondary | ICD-10-CM | POA: Diagnosis not present

## 2019-08-01 DIAGNOSIS — E1169 Type 2 diabetes mellitus with other specified complication: Secondary | ICD-10-CM | POA: Diagnosis not present

## 2019-08-01 DIAGNOSIS — E785 Hyperlipidemia, unspecified: Secondary | ICD-10-CM | POA: Diagnosis not present

## 2019-08-01 DIAGNOSIS — E7800 Pure hypercholesterolemia, unspecified: Secondary | ICD-10-CM | POA: Diagnosis not present

## 2019-08-01 DIAGNOSIS — E782 Mixed hyperlipidemia: Secondary | ICD-10-CM | POA: Diagnosis not present

## 2019-08-01 DIAGNOSIS — E119 Type 2 diabetes mellitus without complications: Secondary | ICD-10-CM | POA: Diagnosis not present

## 2019-08-01 DIAGNOSIS — F331 Major depressive disorder, recurrent, moderate: Secondary | ICD-10-CM | POA: Diagnosis not present

## 2019-08-17 ENCOUNTER — Other Ambulatory Visit: Payer: PPO

## 2019-09-03 DIAGNOSIS — Z6827 Body mass index (BMI) 27.0-27.9, adult: Secondary | ICD-10-CM | POA: Diagnosis not present

## 2019-09-03 DIAGNOSIS — M47816 Spondylosis without myelopathy or radiculopathy, lumbar region: Secondary | ICD-10-CM | POA: Diagnosis not present

## 2019-09-03 DIAGNOSIS — F1120 Opioid dependence, uncomplicated: Secondary | ICD-10-CM | POA: Diagnosis not present

## 2019-09-03 DIAGNOSIS — Z79899 Other long term (current) drug therapy: Secondary | ICD-10-CM | POA: Diagnosis not present

## 2019-09-07 DIAGNOSIS — E119 Type 2 diabetes mellitus without complications: Secondary | ICD-10-CM | POA: Diagnosis not present

## 2019-09-07 DIAGNOSIS — E785 Hyperlipidemia, unspecified: Secondary | ICD-10-CM | POA: Diagnosis not present

## 2019-09-07 DIAGNOSIS — E7800 Pure hypercholesterolemia, unspecified: Secondary | ICD-10-CM | POA: Diagnosis not present

## 2019-09-07 DIAGNOSIS — E1169 Type 2 diabetes mellitus with other specified complication: Secondary | ICD-10-CM | POA: Diagnosis not present

## 2019-09-07 DIAGNOSIS — F331 Major depressive disorder, recurrent, moderate: Secondary | ICD-10-CM | POA: Diagnosis not present

## 2019-09-07 DIAGNOSIS — E782 Mixed hyperlipidemia: Secondary | ICD-10-CM | POA: Diagnosis not present

## 2019-09-09 ENCOUNTER — Ambulatory Visit: Payer: PPO

## 2019-09-25 ENCOUNTER — Ambulatory Visit: Payer: PPO

## 2019-09-26 ENCOUNTER — Ambulatory Visit: Payer: PPO

## 2019-09-26 DIAGNOSIS — M461 Sacroiliitis, not elsewhere classified: Secondary | ICD-10-CM | POA: Diagnosis not present

## 2019-10-05 DIAGNOSIS — E782 Mixed hyperlipidemia: Secondary | ICD-10-CM | POA: Diagnosis not present

## 2019-10-05 DIAGNOSIS — F331 Major depressive disorder, recurrent, moderate: Secondary | ICD-10-CM | POA: Diagnosis not present

## 2019-10-05 DIAGNOSIS — E119 Type 2 diabetes mellitus without complications: Secondary | ICD-10-CM | POA: Diagnosis not present

## 2019-10-05 DIAGNOSIS — E785 Hyperlipidemia, unspecified: Secondary | ICD-10-CM | POA: Diagnosis not present

## 2019-10-05 DIAGNOSIS — E7800 Pure hypercholesterolemia, unspecified: Secondary | ICD-10-CM | POA: Diagnosis not present

## 2019-10-05 DIAGNOSIS — E1169 Type 2 diabetes mellitus with other specified complication: Secondary | ICD-10-CM | POA: Diagnosis not present

## 2019-11-05 ENCOUNTER — Other Ambulatory Visit: Payer: PPO

## 2019-11-21 DIAGNOSIS — E1169 Type 2 diabetes mellitus with other specified complication: Secondary | ICD-10-CM | POA: Diagnosis not present

## 2019-11-21 DIAGNOSIS — F331 Major depressive disorder, recurrent, moderate: Secondary | ICD-10-CM | POA: Diagnosis not present

## 2019-11-21 DIAGNOSIS — E7800 Pure hypercholesterolemia, unspecified: Secondary | ICD-10-CM | POA: Diagnosis not present

## 2019-11-21 DIAGNOSIS — G8929 Other chronic pain: Secondary | ICD-10-CM | POA: Diagnosis not present

## 2019-11-21 DIAGNOSIS — F419 Anxiety disorder, unspecified: Secondary | ICD-10-CM | POA: Diagnosis not present

## 2019-11-21 DIAGNOSIS — E2839 Other primary ovarian failure: Secondary | ICD-10-CM | POA: Diagnosis not present

## 2019-11-27 DIAGNOSIS — F331 Major depressive disorder, recurrent, moderate: Secondary | ICD-10-CM | POA: Diagnosis not present

## 2019-11-27 DIAGNOSIS — E119 Type 2 diabetes mellitus without complications: Secondary | ICD-10-CM | POA: Diagnosis not present

## 2019-11-27 DIAGNOSIS — E785 Hyperlipidemia, unspecified: Secondary | ICD-10-CM | POA: Diagnosis not present

## 2019-11-27 DIAGNOSIS — E7800 Pure hypercholesterolemia, unspecified: Secondary | ICD-10-CM | POA: Diagnosis not present

## 2019-11-27 DIAGNOSIS — E1169 Type 2 diabetes mellitus with other specified complication: Secondary | ICD-10-CM | POA: Diagnosis not present

## 2019-11-27 DIAGNOSIS — E782 Mixed hyperlipidemia: Secondary | ICD-10-CM | POA: Diagnosis not present

## 2019-12-03 DIAGNOSIS — M47816 Spondylosis without myelopathy or radiculopathy, lumbar region: Secondary | ICD-10-CM | POA: Diagnosis not present

## 2019-12-03 DIAGNOSIS — M961 Postlaminectomy syndrome, not elsewhere classified: Secondary | ICD-10-CM | POA: Diagnosis not present

## 2019-12-03 DIAGNOSIS — Z79899 Other long term (current) drug therapy: Secondary | ICD-10-CM | POA: Diagnosis not present

## 2019-12-03 DIAGNOSIS — M461 Sacroiliitis, not elsewhere classified: Secondary | ICD-10-CM | POA: Diagnosis not present

## 2019-12-25 DIAGNOSIS — E119 Type 2 diabetes mellitus without complications: Secondary | ICD-10-CM | POA: Diagnosis not present

## 2019-12-25 DIAGNOSIS — E1169 Type 2 diabetes mellitus with other specified complication: Secondary | ICD-10-CM | POA: Diagnosis not present

## 2019-12-25 DIAGNOSIS — E782 Mixed hyperlipidemia: Secondary | ICD-10-CM | POA: Diagnosis not present

## 2019-12-25 DIAGNOSIS — F331 Major depressive disorder, recurrent, moderate: Secondary | ICD-10-CM | POA: Diagnosis not present

## 2019-12-25 DIAGNOSIS — E785 Hyperlipidemia, unspecified: Secondary | ICD-10-CM | POA: Diagnosis not present

## 2019-12-25 DIAGNOSIS — E7800 Pure hypercholesterolemia, unspecified: Secondary | ICD-10-CM | POA: Diagnosis not present

## 2020-01-15 DIAGNOSIS — T7840XD Allergy, unspecified, subsequent encounter: Secondary | ICD-10-CM | POA: Diagnosis not present

## 2020-01-15 DIAGNOSIS — F331 Major depressive disorder, recurrent, moderate: Secondary | ICD-10-CM | POA: Diagnosis not present

## 2020-01-15 DIAGNOSIS — R5383 Other fatigue: Secondary | ICD-10-CM | POA: Diagnosis not present

## 2020-01-15 DIAGNOSIS — H5789 Other specified disorders of eye and adnexa: Secondary | ICD-10-CM | POA: Diagnosis not present

## 2020-01-22 ENCOUNTER — Other Ambulatory Visit: Payer: Self-pay

## 2020-01-22 ENCOUNTER — Ambulatory Visit
Admission: RE | Admit: 2020-01-22 | Discharge: 2020-01-22 | Disposition: A | Discharge status: Home / Self Care | Payer: PPO | Source: Ambulatory Visit | Attending: Internal Medicine | Admitting: Internal Medicine

## 2020-01-22 DIAGNOSIS — M8589 Other specified disorders of bone density and structure, multiple sites: Secondary | ICD-10-CM | POA: Diagnosis not present

## 2020-01-22 DIAGNOSIS — E119 Type 2 diabetes mellitus without complications: Secondary | ICD-10-CM | POA: Diagnosis not present

## 2020-01-22 DIAGNOSIS — E1169 Type 2 diabetes mellitus with other specified complication: Secondary | ICD-10-CM | POA: Diagnosis not present

## 2020-01-22 DIAGNOSIS — F331 Major depressive disorder, recurrent, moderate: Secondary | ICD-10-CM | POA: Diagnosis not present

## 2020-01-22 DIAGNOSIS — Z780 Asymptomatic menopausal state: Secondary | ICD-10-CM | POA: Diagnosis not present

## 2020-01-22 DIAGNOSIS — E785 Hyperlipidemia, unspecified: Secondary | ICD-10-CM | POA: Diagnosis not present

## 2020-01-22 DIAGNOSIS — E2839 Other primary ovarian failure: Secondary | ICD-10-CM

## 2020-01-22 DIAGNOSIS — E7800 Pure hypercholesterolemia, unspecified: Secondary | ICD-10-CM | POA: Diagnosis not present

## 2020-01-22 DIAGNOSIS — E782 Mixed hyperlipidemia: Secondary | ICD-10-CM | POA: Diagnosis not present

## 2020-02-20 DIAGNOSIS — M461 Sacroiliitis, not elsewhere classified: Secondary | ICD-10-CM | POA: Diagnosis not present

## 2020-02-27 DIAGNOSIS — E119 Type 2 diabetes mellitus without complications: Secondary | ICD-10-CM | POA: Diagnosis not present

## 2020-02-27 DIAGNOSIS — E782 Mixed hyperlipidemia: Secondary | ICD-10-CM | POA: Diagnosis not present

## 2020-02-27 DIAGNOSIS — E1169 Type 2 diabetes mellitus with other specified complication: Secondary | ICD-10-CM | POA: Diagnosis not present

## 2020-02-27 DIAGNOSIS — E785 Hyperlipidemia, unspecified: Secondary | ICD-10-CM | POA: Diagnosis not present

## 2020-02-27 DIAGNOSIS — F331 Major depressive disorder, recurrent, moderate: Secondary | ICD-10-CM | POA: Diagnosis not present

## 2020-02-27 DIAGNOSIS — E7800 Pure hypercholesterolemia, unspecified: Secondary | ICD-10-CM | POA: Diagnosis not present

## 2020-02-28 DIAGNOSIS — H0102A Squamous blepharitis right eye, upper and lower eyelids: Secondary | ICD-10-CM | POA: Diagnosis not present

## 2020-02-28 DIAGNOSIS — H0102B Squamous blepharitis left eye, upper and lower eyelids: Secondary | ICD-10-CM | POA: Diagnosis not present

## 2020-02-28 DIAGNOSIS — E119 Type 2 diabetes mellitus without complications: Secondary | ICD-10-CM | POA: Diagnosis not present

## 2020-02-28 DIAGNOSIS — Z961 Presence of intraocular lens: Secondary | ICD-10-CM | POA: Diagnosis not present

## 2020-03-07 ENCOUNTER — Other Ambulatory Visit: Payer: Self-pay | Admitting: Internal Medicine

## 2020-03-07 DIAGNOSIS — Z1231 Encounter for screening mammogram for malignant neoplasm of breast: Secondary | ICD-10-CM

## 2020-03-21 ENCOUNTER — Ambulatory Visit
Admission: RE | Admit: 2020-03-21 | Discharge: 2020-03-21 | Disposition: A | Discharge status: Home / Self Care | Payer: PPO | Source: Ambulatory Visit | Attending: Internal Medicine | Admitting: Internal Medicine

## 2020-03-21 ENCOUNTER — Other Ambulatory Visit: Payer: Self-pay

## 2020-03-21 DIAGNOSIS — Z1231 Encounter for screening mammogram for malignant neoplasm of breast: Secondary | ICD-10-CM

## 2020-04-01 DIAGNOSIS — E782 Mixed hyperlipidemia: Secondary | ICD-10-CM | POA: Diagnosis not present

## 2020-04-01 DIAGNOSIS — E785 Hyperlipidemia, unspecified: Secondary | ICD-10-CM | POA: Diagnosis not present

## 2020-04-01 DIAGNOSIS — E1169 Type 2 diabetes mellitus with other specified complication: Secondary | ICD-10-CM | POA: Diagnosis not present

## 2020-04-01 DIAGNOSIS — E119 Type 2 diabetes mellitus without complications: Secondary | ICD-10-CM | POA: Diagnosis not present

## 2020-04-01 DIAGNOSIS — E7800 Pure hypercholesterolemia, unspecified: Secondary | ICD-10-CM | POA: Diagnosis not present

## 2020-04-01 DIAGNOSIS — G8929 Other chronic pain: Secondary | ICD-10-CM | POA: Diagnosis not present

## 2020-04-01 DIAGNOSIS — F331 Major depressive disorder, recurrent, moderate: Secondary | ICD-10-CM | POA: Diagnosis not present

## 2020-04-21 DIAGNOSIS — D2271 Melanocytic nevi of right lower limb, including hip: Secondary | ICD-10-CM | POA: Diagnosis not present

## 2020-04-21 DIAGNOSIS — L814 Other melanin hyperpigmentation: Secondary | ICD-10-CM | POA: Diagnosis not present

## 2020-04-21 DIAGNOSIS — L720 Epidermal cyst: Secondary | ICD-10-CM | POA: Diagnosis not present

## 2020-04-21 DIAGNOSIS — L821 Other seborrheic keratosis: Secondary | ICD-10-CM | POA: Diagnosis not present

## 2020-04-21 DIAGNOSIS — D1801 Hemangioma of skin and subcutaneous tissue: Secondary | ICD-10-CM | POA: Diagnosis not present

## 2020-04-21 DIAGNOSIS — D225 Melanocytic nevi of trunk: Secondary | ICD-10-CM | POA: Diagnosis not present

## 2020-04-22 DIAGNOSIS — M461 Sacroiliitis, not elsewhere classified: Secondary | ICD-10-CM | POA: Diagnosis not present

## 2020-04-22 DIAGNOSIS — M5126 Other intervertebral disc displacement, lumbar region: Secondary | ICD-10-CM | POA: Diagnosis not present

## 2020-04-22 DIAGNOSIS — F1120 Opioid dependence, uncomplicated: Secondary | ICD-10-CM | POA: Diagnosis not present

## 2020-04-22 DIAGNOSIS — M47816 Spondylosis without myelopathy or radiculopathy, lumbar region: Secondary | ICD-10-CM | POA: Diagnosis not present

## 2020-04-28 DIAGNOSIS — E782 Mixed hyperlipidemia: Secondary | ICD-10-CM | POA: Diagnosis not present

## 2020-04-28 DIAGNOSIS — E119 Type 2 diabetes mellitus without complications: Secondary | ICD-10-CM | POA: Diagnosis not present

## 2020-04-28 DIAGNOSIS — E7800 Pure hypercholesterolemia, unspecified: Secondary | ICD-10-CM | POA: Diagnosis not present

## 2020-04-28 DIAGNOSIS — G8929 Other chronic pain: Secondary | ICD-10-CM | POA: Diagnosis not present

## 2020-04-28 DIAGNOSIS — F331 Major depressive disorder, recurrent, moderate: Secondary | ICD-10-CM | POA: Diagnosis not present

## 2020-04-28 DIAGNOSIS — E1169 Type 2 diabetes mellitus with other specified complication: Secondary | ICD-10-CM | POA: Diagnosis not present

## 2020-04-28 DIAGNOSIS — E785 Hyperlipidemia, unspecified: Secondary | ICD-10-CM | POA: Diagnosis not present

## 2020-05-17 ENCOUNTER — Ambulatory Visit: Payer: PPO | Attending: Internal Medicine

## 2020-05-17 DIAGNOSIS — Z23 Encounter for immunization: Secondary | ICD-10-CM

## 2020-05-17 NOTE — Progress Notes (Signed)
   Covid-19 Vaccination Clinic  Name:  Debbie Montoya    MRN: [id number] DOB: 1936-07-05  05/17/2020  [name] was observed post Covid-19 immunization for 15 minutes without incident. She was provided with Vaccine Information Sheet and instruction to access the V-Safe system.   [name] was instructed to call 911 with any severe reactions post vaccine: Marland Kitchen Difficulty breathing  . Swelling of face and throat  . A fast heartbeat  . A bad rash all over body  . Dizziness and weakness

## 2020-05-29 DIAGNOSIS — F331 Major depressive disorder, recurrent, moderate: Secondary | ICD-10-CM | POA: Diagnosis not present

## 2020-05-29 DIAGNOSIS — E1169 Type 2 diabetes mellitus with other specified complication: Secondary | ICD-10-CM | POA: Diagnosis not present

## 2020-05-29 DIAGNOSIS — E785 Hyperlipidemia, unspecified: Secondary | ICD-10-CM | POA: Diagnosis not present

## 2020-05-29 DIAGNOSIS — E7800 Pure hypercholesterolemia, unspecified: Secondary | ICD-10-CM | POA: Diagnosis not present

## 2020-05-29 DIAGNOSIS — E119 Type 2 diabetes mellitus without complications: Secondary | ICD-10-CM | POA: Diagnosis not present

## 2020-05-29 DIAGNOSIS — G8929 Other chronic pain: Secondary | ICD-10-CM | POA: Diagnosis not present

## 2020-05-29 DIAGNOSIS — E782 Mixed hyperlipidemia: Secondary | ICD-10-CM | POA: Diagnosis not present

## 2020-06-19 DIAGNOSIS — K219 Gastro-esophageal reflux disease without esophagitis: Secondary | ICD-10-CM | POA: Diagnosis not present

## 2020-06-19 DIAGNOSIS — E119 Type 2 diabetes mellitus without complications: Secondary | ICD-10-CM | POA: Diagnosis not present

## 2020-06-19 DIAGNOSIS — E1169 Type 2 diabetes mellitus with other specified complication: Secondary | ICD-10-CM | POA: Diagnosis not present

## 2020-06-19 DIAGNOSIS — G8929 Other chronic pain: Secondary | ICD-10-CM | POA: Diagnosis not present

## 2020-06-19 DIAGNOSIS — E782 Mixed hyperlipidemia: Secondary | ICD-10-CM | POA: Diagnosis not present

## 2020-06-19 DIAGNOSIS — E7800 Pure hypercholesterolemia, unspecified: Secondary | ICD-10-CM | POA: Diagnosis not present

## 2020-06-19 DIAGNOSIS — F331 Major depressive disorder, recurrent, moderate: Secondary | ICD-10-CM | POA: Diagnosis not present

## 2020-06-19 DIAGNOSIS — E785 Hyperlipidemia, unspecified: Secondary | ICD-10-CM | POA: Diagnosis not present

## 2020-07-15 DIAGNOSIS — Z1389 Encounter for screening for other disorder: Secondary | ICD-10-CM | POA: Diagnosis not present

## 2020-07-15 DIAGNOSIS — E1169 Type 2 diabetes mellitus with other specified complication: Secondary | ICD-10-CM | POA: Diagnosis not present

## 2020-07-15 DIAGNOSIS — F418 Other specified anxiety disorders: Secondary | ICD-10-CM | POA: Diagnosis not present

## 2020-07-15 DIAGNOSIS — Z0000 Encounter for general adult medical examination without abnormal findings: Secondary | ICD-10-CM | POA: Diagnosis not present

## 2020-07-15 DIAGNOSIS — R269 Unspecified abnormalities of gait and mobility: Secondary | ICD-10-CM | POA: Diagnosis not present

## 2020-07-15 DIAGNOSIS — G8929 Other chronic pain: Secondary | ICD-10-CM | POA: Diagnosis not present

## 2020-07-30 DIAGNOSIS — E782 Mixed hyperlipidemia: Secondary | ICD-10-CM | POA: Diagnosis not present

## 2020-07-30 DIAGNOSIS — F331 Major depressive disorder, recurrent, moderate: Secondary | ICD-10-CM | POA: Diagnosis not present

## 2020-07-30 DIAGNOSIS — G8929 Other chronic pain: Secondary | ICD-10-CM | POA: Diagnosis not present

## 2020-07-30 DIAGNOSIS — E785 Hyperlipidemia, unspecified: Secondary | ICD-10-CM | POA: Diagnosis not present

## 2020-07-30 DIAGNOSIS — E7800 Pure hypercholesterolemia, unspecified: Secondary | ICD-10-CM | POA: Diagnosis not present

## 2020-07-30 DIAGNOSIS — E119 Type 2 diabetes mellitus without complications: Secondary | ICD-10-CM | POA: Diagnosis not present

## 2020-07-30 DIAGNOSIS — K219 Gastro-esophageal reflux disease without esophagitis: Secondary | ICD-10-CM | POA: Diagnosis not present

## 2020-07-30 DIAGNOSIS — E1169 Type 2 diabetes mellitus with other specified complication: Secondary | ICD-10-CM | POA: Diagnosis not present

## 2020-08-07 DIAGNOSIS — F1120 Opioid dependence, uncomplicated: Secondary | ICD-10-CM | POA: Diagnosis not present

## 2020-08-07 DIAGNOSIS — M47816 Spondylosis without myelopathy or radiculopathy, lumbar region: Secondary | ICD-10-CM | POA: Diagnosis not present

## 2020-08-07 DIAGNOSIS — M961 Postlaminectomy syndrome, not elsewhere classified: Secondary | ICD-10-CM | POA: Diagnosis not present

## 2020-08-07 DIAGNOSIS — Z79899 Other long term (current) drug therapy: Secondary | ICD-10-CM | POA: Diagnosis not present

## 2020-08-15 DIAGNOSIS — G8929 Other chronic pain: Secondary | ICD-10-CM | POA: Diagnosis not present

## 2020-08-15 DIAGNOSIS — F331 Major depressive disorder, recurrent, moderate: Secondary | ICD-10-CM | POA: Diagnosis not present

## 2020-08-15 DIAGNOSIS — E785 Hyperlipidemia, unspecified: Secondary | ICD-10-CM | POA: Diagnosis not present

## 2020-08-15 DIAGNOSIS — E1169 Type 2 diabetes mellitus with other specified complication: Secondary | ICD-10-CM | POA: Diagnosis not present

## 2020-08-15 DIAGNOSIS — E119 Type 2 diabetes mellitus without complications: Secondary | ICD-10-CM | POA: Diagnosis not present

## 2020-08-15 DIAGNOSIS — E7800 Pure hypercholesterolemia, unspecified: Secondary | ICD-10-CM | POA: Diagnosis not present

## 2020-08-15 DIAGNOSIS — K219 Gastro-esophageal reflux disease without esophagitis: Secondary | ICD-10-CM | POA: Diagnosis not present

## 2020-08-15 DIAGNOSIS — E782 Mixed hyperlipidemia: Secondary | ICD-10-CM | POA: Diagnosis not present

## 2020-08-25 DIAGNOSIS — R2681 Unsteadiness on feet: Secondary | ICD-10-CM | POA: Diagnosis not present

## 2020-08-25 DIAGNOSIS — M6281 Muscle weakness (generalized): Secondary | ICD-10-CM | POA: Diagnosis not present

## 2020-08-25 DIAGNOSIS — R262 Difficulty in walking, not elsewhere classified: Secondary | ICD-10-CM | POA: Diagnosis not present

## 2020-09-02 DIAGNOSIS — R262 Difficulty in walking, not elsewhere classified: Secondary | ICD-10-CM | POA: Diagnosis not present

## 2020-09-02 DIAGNOSIS — R2681 Unsteadiness on feet: Secondary | ICD-10-CM | POA: Diagnosis not present

## 2020-09-02 DIAGNOSIS — M6281 Muscle weakness (generalized): Secondary | ICD-10-CM | POA: Diagnosis not present

## 2020-09-09 DIAGNOSIS — R2681 Unsteadiness on feet: Secondary | ICD-10-CM | POA: Diagnosis not present

## 2020-09-09 DIAGNOSIS — M6281 Muscle weakness (generalized): Secondary | ICD-10-CM | POA: Diagnosis not present

## 2020-09-09 DIAGNOSIS — R262 Difficulty in walking, not elsewhere classified: Secondary | ICD-10-CM | POA: Diagnosis not present

## 2020-09-10 DIAGNOSIS — K219 Gastro-esophageal reflux disease without esophagitis: Secondary | ICD-10-CM | POA: Diagnosis not present

## 2020-09-10 DIAGNOSIS — E119 Type 2 diabetes mellitus without complications: Secondary | ICD-10-CM | POA: Diagnosis not present

## 2020-09-10 DIAGNOSIS — E782 Mixed hyperlipidemia: Secondary | ICD-10-CM | POA: Diagnosis not present

## 2020-09-10 DIAGNOSIS — G8929 Other chronic pain: Secondary | ICD-10-CM | POA: Diagnosis not present

## 2020-09-10 DIAGNOSIS — E1169 Type 2 diabetes mellitus with other specified complication: Secondary | ICD-10-CM | POA: Diagnosis not present

## 2020-09-10 DIAGNOSIS — E785 Hyperlipidemia, unspecified: Secondary | ICD-10-CM | POA: Diagnosis not present

## 2020-09-10 DIAGNOSIS — F331 Major depressive disorder, recurrent, moderate: Secondary | ICD-10-CM | POA: Diagnosis not present

## 2020-09-10 DIAGNOSIS — E7800 Pure hypercholesterolemia, unspecified: Secondary | ICD-10-CM | POA: Diagnosis not present

## 2020-09-23 DIAGNOSIS — R2681 Unsteadiness on feet: Secondary | ICD-10-CM | POA: Diagnosis not present

## 2020-09-23 DIAGNOSIS — R262 Difficulty in walking, not elsewhere classified: Secondary | ICD-10-CM | POA: Diagnosis not present

## 2020-09-23 DIAGNOSIS — M6281 Muscle weakness (generalized): Secondary | ICD-10-CM | POA: Diagnosis not present

## 2020-09-30 DIAGNOSIS — R2681 Unsteadiness on feet: Secondary | ICD-10-CM | POA: Diagnosis not present

## 2020-09-30 DIAGNOSIS — M6281 Muscle weakness (generalized): Secondary | ICD-10-CM | POA: Diagnosis not present

## 2020-09-30 DIAGNOSIS — R262 Difficulty in walking, not elsewhere classified: Secondary | ICD-10-CM | POA: Diagnosis not present

## 2020-10-13 DIAGNOSIS — E782 Mixed hyperlipidemia: Secondary | ICD-10-CM | POA: Diagnosis not present

## 2020-10-13 DIAGNOSIS — K219 Gastro-esophageal reflux disease without esophagitis: Secondary | ICD-10-CM | POA: Diagnosis not present

## 2020-10-13 DIAGNOSIS — E785 Hyperlipidemia, unspecified: Secondary | ICD-10-CM | POA: Diagnosis not present

## 2020-10-13 DIAGNOSIS — F331 Major depressive disorder, recurrent, moderate: Secondary | ICD-10-CM | POA: Diagnosis not present

## 2020-10-13 DIAGNOSIS — G8929 Other chronic pain: Secondary | ICD-10-CM | POA: Diagnosis not present

## 2020-10-13 DIAGNOSIS — E7800 Pure hypercholesterolemia, unspecified: Secondary | ICD-10-CM | POA: Diagnosis not present

## 2020-10-13 DIAGNOSIS — E119 Type 2 diabetes mellitus without complications: Secondary | ICD-10-CM | POA: Diagnosis not present

## 2020-10-13 DIAGNOSIS — E1169 Type 2 diabetes mellitus with other specified complication: Secondary | ICD-10-CM | POA: Diagnosis not present

## 2020-10-29 DIAGNOSIS — M461 Sacroiliitis, not elsewhere classified: Secondary | ICD-10-CM | POA: Diagnosis not present

## 2020-10-29 DIAGNOSIS — Z6831 Body mass index (BMI) 31.0-31.9, adult: Secondary | ICD-10-CM | POA: Diagnosis not present

## 2020-10-29 DIAGNOSIS — R030 Elevated blood-pressure reading, without diagnosis of hypertension: Secondary | ICD-10-CM | POA: Diagnosis not present

## 2020-11-06 DIAGNOSIS — M461 Sacroiliitis, not elsewhere classified: Secondary | ICD-10-CM | POA: Diagnosis not present

## 2020-11-06 DIAGNOSIS — M961 Postlaminectomy syndrome, not elsewhere classified: Secondary | ICD-10-CM | POA: Diagnosis not present

## 2020-11-06 DIAGNOSIS — Z79899 Other long term (current) drug therapy: Secondary | ICD-10-CM | POA: Diagnosis not present

## 2020-11-06 DIAGNOSIS — F1120 Opioid dependence, uncomplicated: Secondary | ICD-10-CM | POA: Diagnosis not present

## 2020-11-11 DIAGNOSIS — E7800 Pure hypercholesterolemia, unspecified: Secondary | ICD-10-CM | POA: Diagnosis not present

## 2020-11-11 DIAGNOSIS — E785 Hyperlipidemia, unspecified: Secondary | ICD-10-CM | POA: Diagnosis not present

## 2020-11-11 DIAGNOSIS — K219 Gastro-esophageal reflux disease without esophagitis: Secondary | ICD-10-CM | POA: Diagnosis not present

## 2020-11-11 DIAGNOSIS — E119 Type 2 diabetes mellitus without complications: Secondary | ICD-10-CM | POA: Diagnosis not present

## 2020-11-11 DIAGNOSIS — G8929 Other chronic pain: Secondary | ICD-10-CM | POA: Diagnosis not present

## 2020-11-11 DIAGNOSIS — F331 Major depressive disorder, recurrent, moderate: Secondary | ICD-10-CM | POA: Diagnosis not present

## 2020-11-11 DIAGNOSIS — E1169 Type 2 diabetes mellitus with other specified complication: Secondary | ICD-10-CM | POA: Diagnosis not present

## 2020-11-11 DIAGNOSIS — E782 Mixed hyperlipidemia: Secondary | ICD-10-CM | POA: Diagnosis not present

## 2020-11-19 DIAGNOSIS — M461 Sacroiliitis, not elsewhere classified: Secondary | ICD-10-CM | POA: Diagnosis not present

## 2020-12-03 DIAGNOSIS — M461 Sacroiliitis, not elsewhere classified: Secondary | ICD-10-CM | POA: Diagnosis not present

## 2020-12-03 DIAGNOSIS — R030 Elevated blood-pressure reading, without diagnosis of hypertension: Secondary | ICD-10-CM | POA: Diagnosis not present

## 2020-12-03 DIAGNOSIS — Z6831 Body mass index (BMI) 31.0-31.9, adult: Secondary | ICD-10-CM | POA: Diagnosis not present

## 2020-12-09 DIAGNOSIS — E785 Hyperlipidemia, unspecified: Secondary | ICD-10-CM | POA: Diagnosis not present

## 2020-12-09 DIAGNOSIS — E782 Mixed hyperlipidemia: Secondary | ICD-10-CM | POA: Diagnosis not present

## 2020-12-09 DIAGNOSIS — E7800 Pure hypercholesterolemia, unspecified: Secondary | ICD-10-CM | POA: Diagnosis not present

## 2020-12-09 DIAGNOSIS — E1169 Type 2 diabetes mellitus with other specified complication: Secondary | ICD-10-CM | POA: Diagnosis not present

## 2020-12-09 DIAGNOSIS — F331 Major depressive disorder, recurrent, moderate: Secondary | ICD-10-CM | POA: Diagnosis not present

## 2020-12-09 DIAGNOSIS — E119 Type 2 diabetes mellitus without complications: Secondary | ICD-10-CM | POA: Diagnosis not present

## 2020-12-09 DIAGNOSIS — G8929 Other chronic pain: Secondary | ICD-10-CM | POA: Diagnosis not present

## 2020-12-09 DIAGNOSIS — K219 Gastro-esophageal reflux disease without esophagitis: Secondary | ICD-10-CM | POA: Diagnosis not present

## 2020-12-31 DIAGNOSIS — N1831 Chronic kidney disease, stage 3a: Secondary | ICD-10-CM | POA: Diagnosis not present

## 2020-12-31 DIAGNOSIS — E7800 Pure hypercholesterolemia, unspecified: Secondary | ICD-10-CM | POA: Diagnosis not present

## 2020-12-31 DIAGNOSIS — F331 Major depressive disorder, recurrent, moderate: Secondary | ICD-10-CM | POA: Diagnosis not present

## 2020-12-31 DIAGNOSIS — E782 Mixed hyperlipidemia: Secondary | ICD-10-CM | POA: Diagnosis not present

## 2020-12-31 DIAGNOSIS — G8929 Other chronic pain: Secondary | ICD-10-CM | POA: Diagnosis not present

## 2020-12-31 DIAGNOSIS — E119 Type 2 diabetes mellitus without complications: Secondary | ICD-10-CM | POA: Diagnosis not present

## 2020-12-31 DIAGNOSIS — E1169 Type 2 diabetes mellitus with other specified complication: Secondary | ICD-10-CM | POA: Diagnosis not present

## 2020-12-31 DIAGNOSIS — K219 Gastro-esophageal reflux disease without esophagitis: Secondary | ICD-10-CM | POA: Diagnosis not present

## 2021-01-03 IMAGING — MG DIGITAL SCREENING BILAT W/ TOMO W/ CAD
6 of 10 series · 6 of 30 positions shown · non-contrast
Comparison: Previous exam(s).

CLINICAL DATA: Screening.

EXAM:
DIGITAL SCREENING BILATERAL MAMMOGRAM WITH TOMO AND CAD

[R CC synth-2D (1 of 2)]
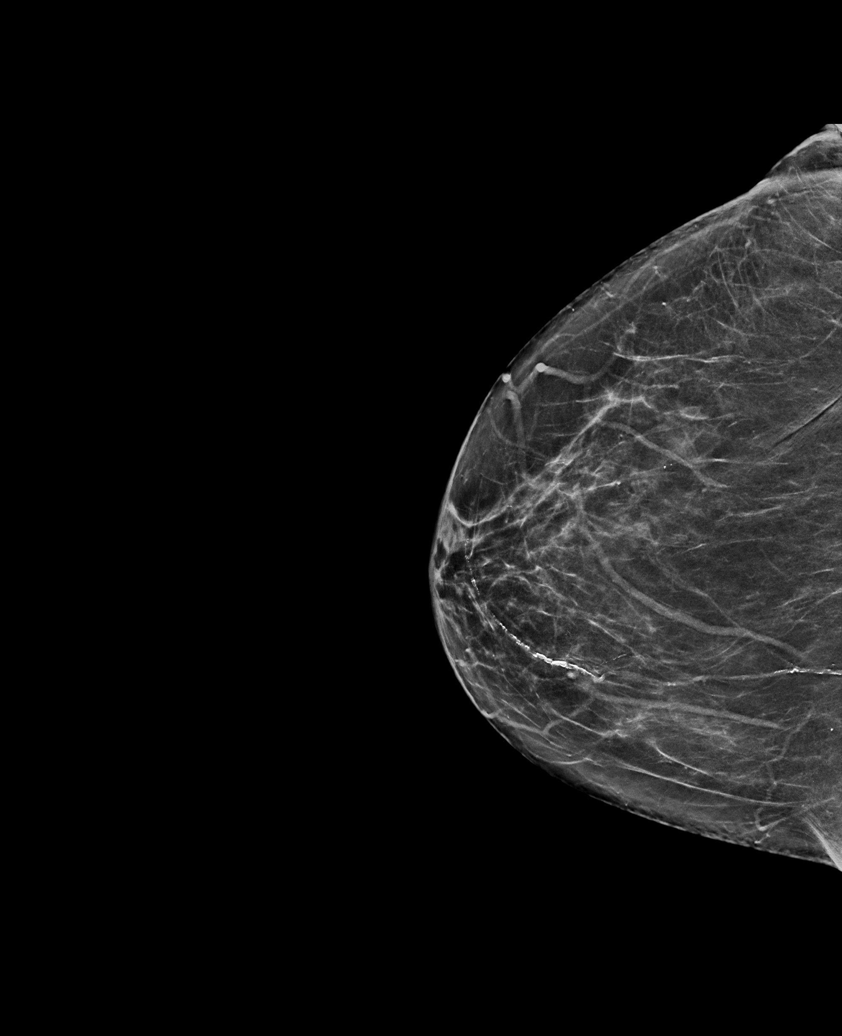

[R MLO synth-2D]
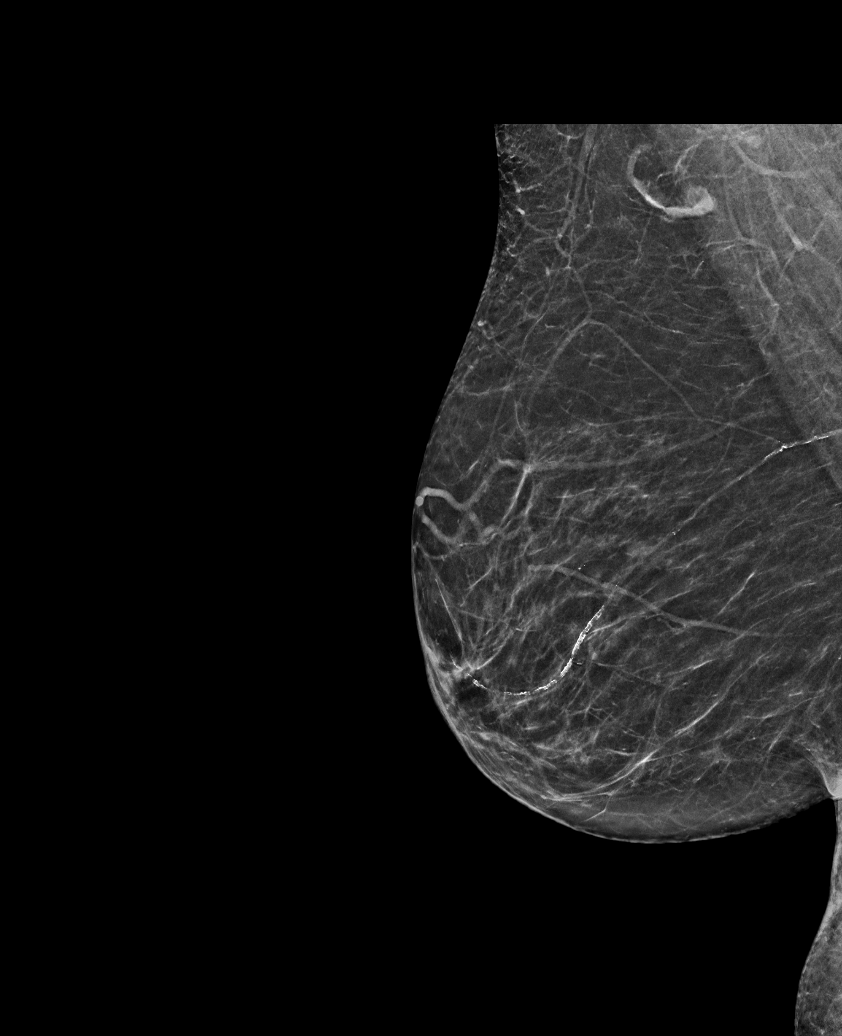

[L CC synth-2D]
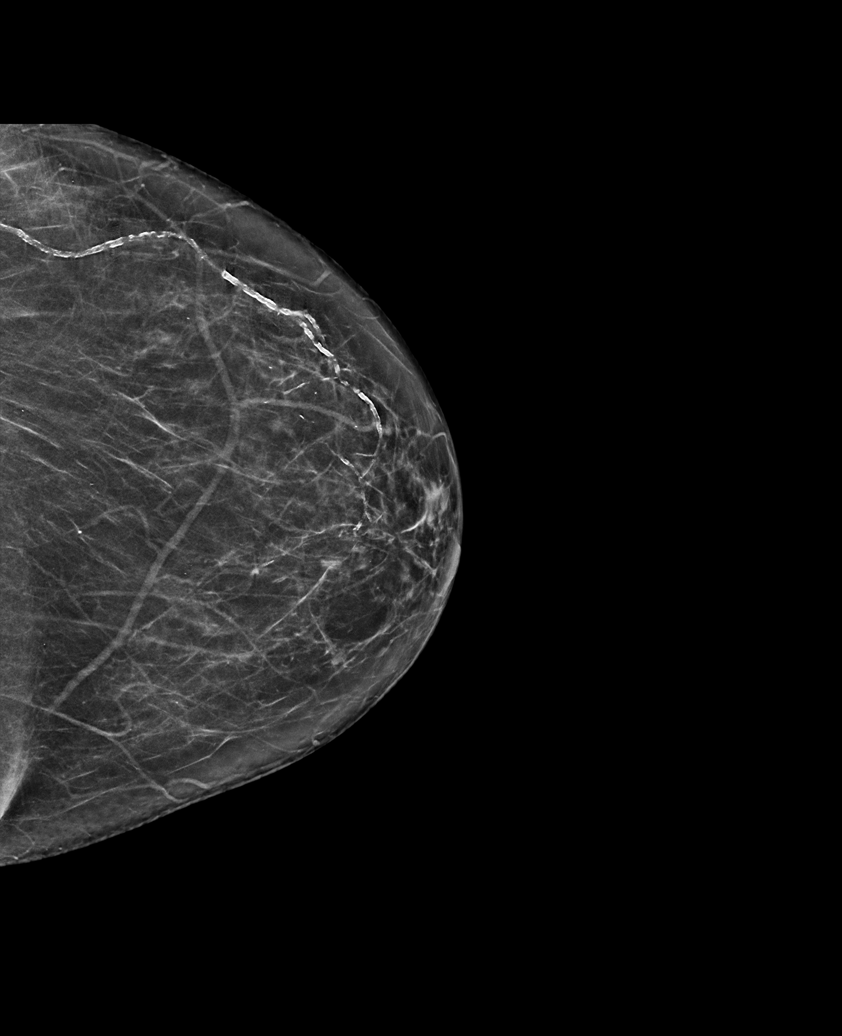

[L MLO synth-2D]
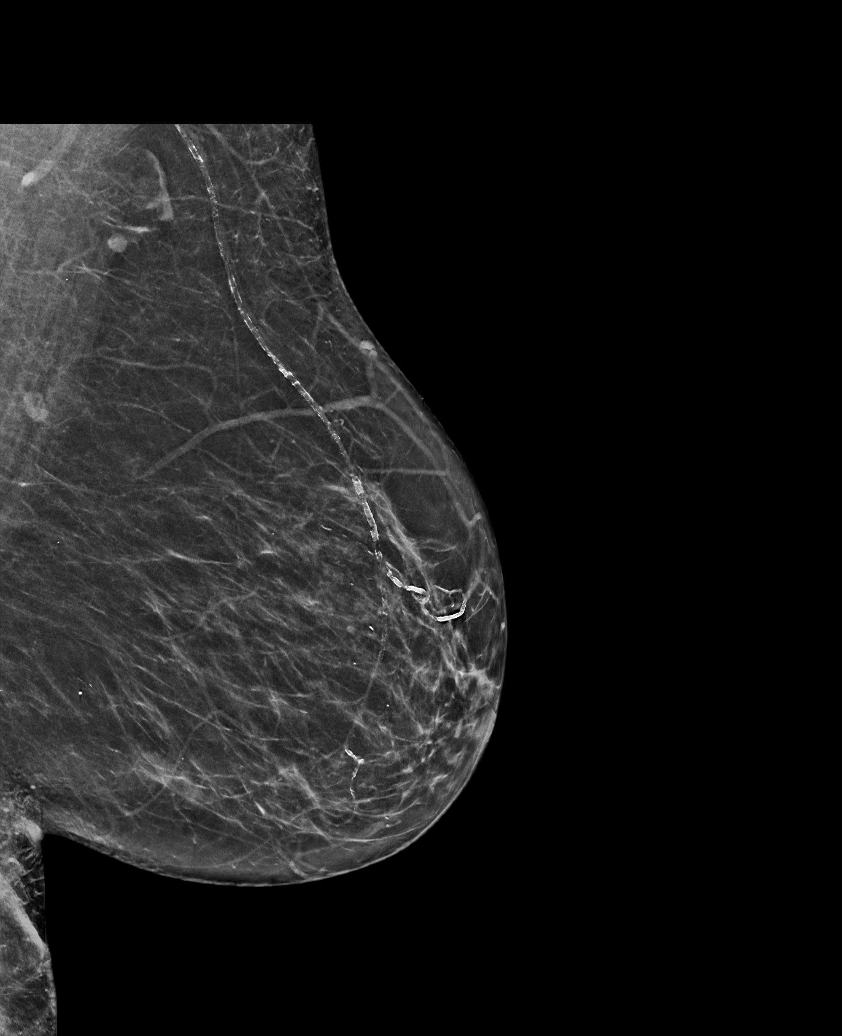

[R CC synth-2D (2 of 2)]
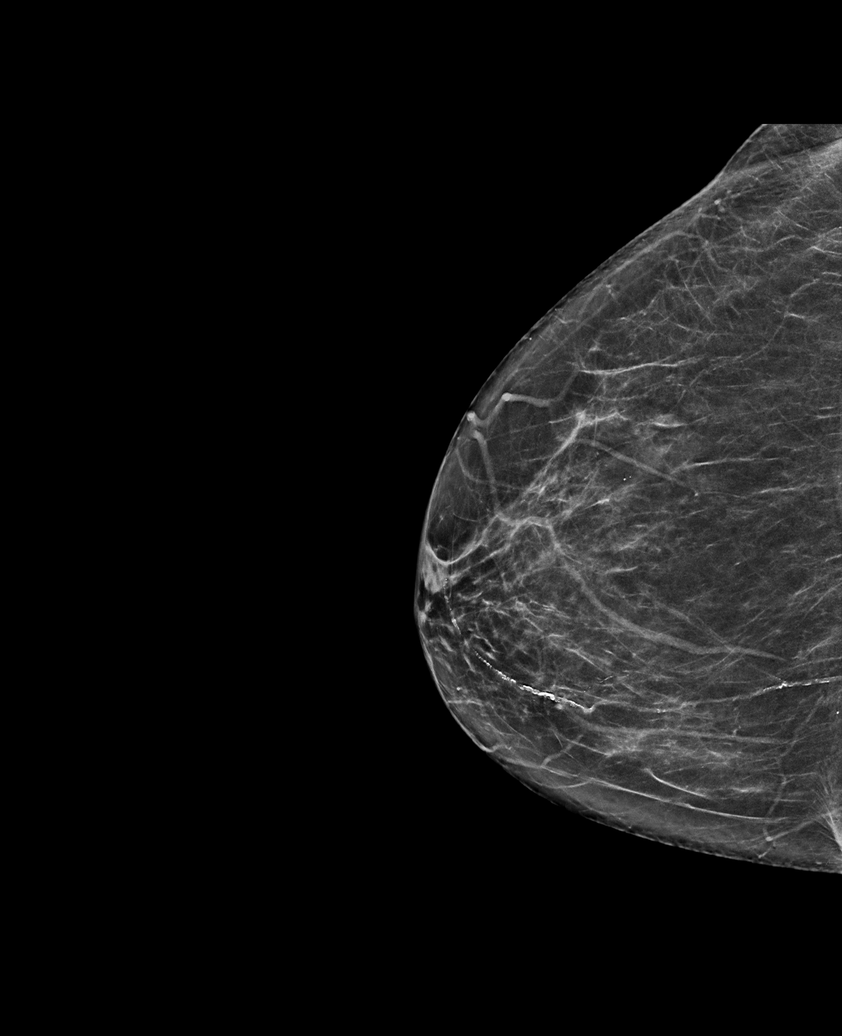

[R CC tomo · tomo slice 33/65.0]
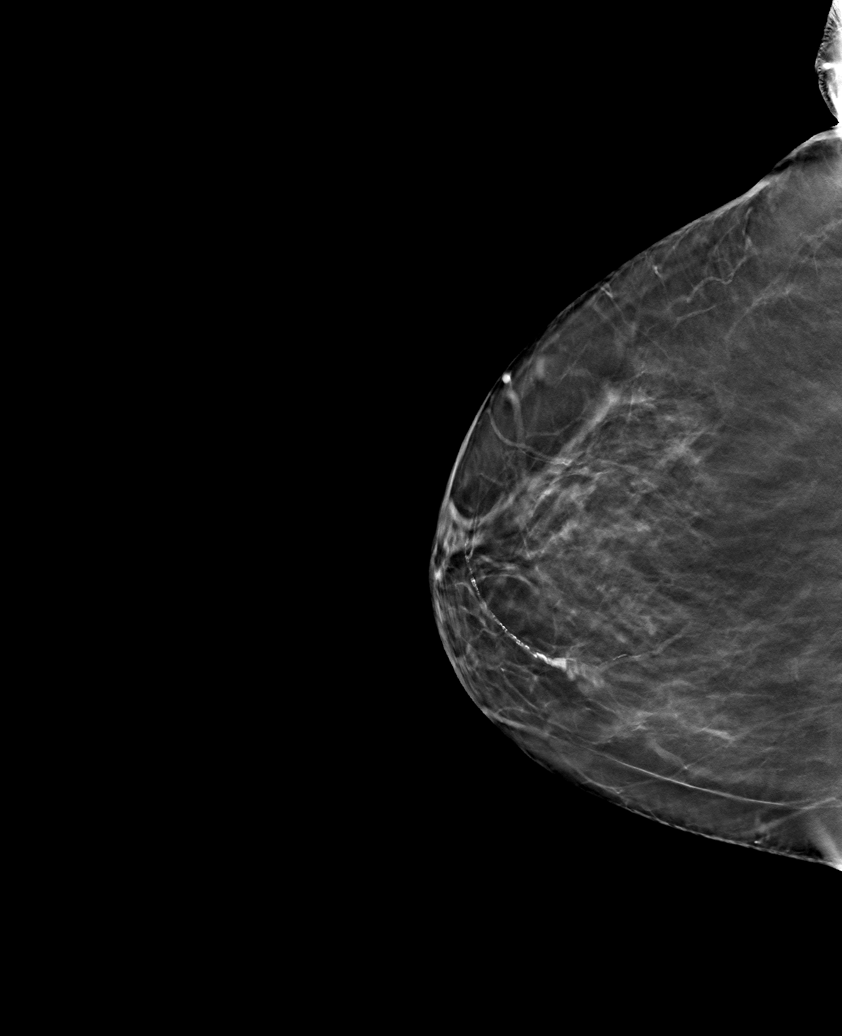

[6 of 30 positions shown; findings below may reference images not displayed]

ACR Breast Density Category b: There are scattered areas of
fibroglandular density.
FINDINGS: There are no findings suspicious for malignancy. Images were
processed with CAD.
IMPRESSION: No mammographic evidence of malignancy. A result letter of this
screening mammogram will be mailed directly to the patient.

RECOMMENDATION:
Screening mammogram in one year. (Code:[id number])

BI-RADS CATEGORY  1: Negative.

## 2021-01-12 DIAGNOSIS — F418 Other specified anxiety disorders: Secondary | ICD-10-CM | POA: Diagnosis not present

## 2021-01-12 DIAGNOSIS — R269 Unspecified abnormalities of gait and mobility: Secondary | ICD-10-CM | POA: Diagnosis not present

## 2021-01-12 DIAGNOSIS — E1169 Type 2 diabetes mellitus with other specified complication: Secondary | ICD-10-CM | POA: Diagnosis not present

## 2021-01-12 DIAGNOSIS — N1831 Chronic kidney disease, stage 3a: Secondary | ICD-10-CM | POA: Diagnosis not present

## 2021-01-19 DIAGNOSIS — Z79899 Other long term (current) drug therapy: Secondary | ICD-10-CM | POA: Diagnosis not present

## 2021-01-19 DIAGNOSIS — M461 Sacroiliitis, not elsewhere classified: Secondary | ICD-10-CM | POA: Diagnosis not present

## 2021-01-19 DIAGNOSIS — F1120 Opioid dependence, uncomplicated: Secondary | ICD-10-CM | POA: Diagnosis not present

## 2021-01-19 DIAGNOSIS — M961 Postlaminectomy syndrome, not elsewhere classified: Secondary | ICD-10-CM | POA: Diagnosis not present

## 2021-02-03 DIAGNOSIS — E1169 Type 2 diabetes mellitus with other specified complication: Secondary | ICD-10-CM | POA: Diagnosis not present

## 2021-02-03 DIAGNOSIS — K219 Gastro-esophageal reflux disease without esophagitis: Secondary | ICD-10-CM | POA: Diagnosis not present

## 2021-02-03 DIAGNOSIS — N1831 Chronic kidney disease, stage 3a: Secondary | ICD-10-CM | POA: Diagnosis not present

## 2021-02-03 DIAGNOSIS — E785 Hyperlipidemia, unspecified: Secondary | ICD-10-CM | POA: Diagnosis not present

## 2021-02-03 DIAGNOSIS — E7800 Pure hypercholesterolemia, unspecified: Secondary | ICD-10-CM | POA: Diagnosis not present

## 2021-02-03 DIAGNOSIS — E782 Mixed hyperlipidemia: Secondary | ICD-10-CM | POA: Diagnosis not present

## 2021-02-03 DIAGNOSIS — E119 Type 2 diabetes mellitus without complications: Secondary | ICD-10-CM | POA: Diagnosis not present

## 2021-02-03 DIAGNOSIS — F331 Major depressive disorder, recurrent, moderate: Secondary | ICD-10-CM | POA: Diagnosis not present

## 2021-02-11 DIAGNOSIS — M47816 Spondylosis without myelopathy or radiculopathy, lumbar region: Secondary | ICD-10-CM | POA: Diagnosis not present

## 2021-02-18 ENCOUNTER — Other Ambulatory Visit: Payer: Self-pay | Admitting: Internal Medicine

## 2021-02-18 DIAGNOSIS — Z1231 Encounter for screening mammogram for malignant neoplasm of breast: Secondary | ICD-10-CM

## 2021-02-18 DIAGNOSIS — L720 Epidermal cyst: Secondary | ICD-10-CM | POA: Diagnosis not present

## 2021-02-18 DIAGNOSIS — L292 Pruritus vulvae: Secondary | ICD-10-CM | POA: Diagnosis not present

## 2021-02-18 DIAGNOSIS — N898 Other specified noninflammatory disorders of vagina: Secondary | ICD-10-CM | POA: Diagnosis not present

## 2021-03-04 DIAGNOSIS — G8929 Other chronic pain: Secondary | ICD-10-CM | POA: Diagnosis not present

## 2021-03-04 DIAGNOSIS — E782 Mixed hyperlipidemia: Secondary | ICD-10-CM | POA: Diagnosis not present

## 2021-03-04 DIAGNOSIS — E785 Hyperlipidemia, unspecified: Secondary | ICD-10-CM | POA: Diagnosis not present

## 2021-03-04 DIAGNOSIS — E119 Type 2 diabetes mellitus without complications: Secondary | ICD-10-CM | POA: Diagnosis not present

## 2021-03-04 DIAGNOSIS — E7800 Pure hypercholesterolemia, unspecified: Secondary | ICD-10-CM | POA: Diagnosis not present

## 2021-03-04 DIAGNOSIS — E1169 Type 2 diabetes mellitus with other specified complication: Secondary | ICD-10-CM | POA: Diagnosis not present

## 2021-03-04 DIAGNOSIS — N1831 Chronic kidney disease, stage 3a: Secondary | ICD-10-CM | POA: Diagnosis not present

## 2021-03-04 DIAGNOSIS — K219 Gastro-esophageal reflux disease without esophagitis: Secondary | ICD-10-CM | POA: Diagnosis not present

## 2021-03-04 DIAGNOSIS — F331 Major depressive disorder, recurrent, moderate: Secondary | ICD-10-CM | POA: Diagnosis not present

## 2021-04-03 DIAGNOSIS — G8929 Other chronic pain: Secondary | ICD-10-CM | POA: Diagnosis not present

## 2021-04-03 DIAGNOSIS — F331 Major depressive disorder, recurrent, moderate: Secondary | ICD-10-CM | POA: Diagnosis not present

## 2021-04-03 DIAGNOSIS — E119 Type 2 diabetes mellitus without complications: Secondary | ICD-10-CM | POA: Diagnosis not present

## 2021-04-03 DIAGNOSIS — E1169 Type 2 diabetes mellitus with other specified complication: Secondary | ICD-10-CM | POA: Diagnosis not present

## 2021-04-03 DIAGNOSIS — K219 Gastro-esophageal reflux disease without esophagitis: Secondary | ICD-10-CM | POA: Diagnosis not present

## 2021-04-03 DIAGNOSIS — N1831 Chronic kidney disease, stage 3a: Secondary | ICD-10-CM | POA: Diagnosis not present

## 2021-04-03 DIAGNOSIS — E782 Mixed hyperlipidemia: Secondary | ICD-10-CM | POA: Diagnosis not present

## 2021-04-13 ENCOUNTER — Ambulatory Visit
Admission: RE | Admit: 2021-04-13 | Discharge: 2021-04-13 | Disposition: A | Discharge status: Home / Self Care | Payer: PPO | Source: Ambulatory Visit | Attending: Internal Medicine | Admitting: Internal Medicine

## 2021-04-13 ENCOUNTER — Other Ambulatory Visit: Payer: Self-pay

## 2021-04-13 DIAGNOSIS — Z1231 Encounter for screening mammogram for malignant neoplasm of breast: Secondary | ICD-10-CM | POA: Diagnosis not present

## 2021-04-16 DIAGNOSIS — H0102A Squamous blepharitis right eye, upper and lower eyelids: Secondary | ICD-10-CM | POA: Diagnosis not present

## 2021-04-16 DIAGNOSIS — Z961 Presence of intraocular lens: Secondary | ICD-10-CM | POA: Diagnosis not present

## 2021-04-16 DIAGNOSIS — E119 Type 2 diabetes mellitus without complications: Secondary | ICD-10-CM | POA: Diagnosis not present

## 2021-04-16 DIAGNOSIS — H26493 Other secondary cataract, bilateral: Secondary | ICD-10-CM | POA: Diagnosis not present

## 2021-04-16 DIAGNOSIS — H0102B Squamous blepharitis left eye, upper and lower eyelids: Secondary | ICD-10-CM | POA: Diagnosis not present

## 2021-05-05 DIAGNOSIS — H26491 Other secondary cataract, right eye: Secondary | ICD-10-CM | POA: Diagnosis not present

## 2021-05-21 DIAGNOSIS — M50222 Other cervical disc displacement at C5-C6 level: Secondary | ICD-10-CM | POA: Diagnosis not present

## 2021-05-21 DIAGNOSIS — M7918 Myalgia, other site: Secondary | ICD-10-CM | POA: Diagnosis not present

## 2021-05-21 DIAGNOSIS — M47816 Spondylosis without myelopathy or radiculopathy, lumbar region: Secondary | ICD-10-CM | POA: Diagnosis not present

## 2021-05-21 DIAGNOSIS — M542 Cervicalgia: Secondary | ICD-10-CM | POA: Diagnosis not present

## 2021-06-01 DIAGNOSIS — K219 Gastro-esophageal reflux disease without esophagitis: Secondary | ICD-10-CM | POA: Diagnosis not present

## 2021-06-01 DIAGNOSIS — E119 Type 2 diabetes mellitus without complications: Secondary | ICD-10-CM | POA: Diagnosis not present

## 2021-06-01 DIAGNOSIS — E7800 Pure hypercholesterolemia, unspecified: Secondary | ICD-10-CM | POA: Diagnosis not present

## 2021-06-01 DIAGNOSIS — G8929 Other chronic pain: Secondary | ICD-10-CM | POA: Diagnosis not present

## 2021-06-01 DIAGNOSIS — E1169 Type 2 diabetes mellitus with other specified complication: Secondary | ICD-10-CM | POA: Diagnosis not present

## 2021-06-01 DIAGNOSIS — N1831 Chronic kidney disease, stage 3a: Secondary | ICD-10-CM | POA: Diagnosis not present

## 2021-06-01 DIAGNOSIS — F331 Major depressive disorder, recurrent, moderate: Secondary | ICD-10-CM | POA: Diagnosis not present

## 2021-06-01 DIAGNOSIS — E782 Mixed hyperlipidemia: Secondary | ICD-10-CM | POA: Diagnosis not present

## 2021-06-17 ENCOUNTER — Other Ambulatory Visit: Payer: Self-pay | Admitting: Physical Medicine and Rehabilitation

## 2021-06-17 DIAGNOSIS — M961 Postlaminectomy syndrome, not elsewhere classified: Secondary | ICD-10-CM

## 2021-06-30 DIAGNOSIS — K219 Gastro-esophageal reflux disease without esophagitis: Secondary | ICD-10-CM | POA: Diagnosis not present

## 2021-06-30 DIAGNOSIS — E119 Type 2 diabetes mellitus without complications: Secondary | ICD-10-CM | POA: Diagnosis not present

## 2021-06-30 DIAGNOSIS — E7800 Pure hypercholesterolemia, unspecified: Secondary | ICD-10-CM | POA: Diagnosis not present

## 2021-06-30 DIAGNOSIS — E782 Mixed hyperlipidemia: Secondary | ICD-10-CM | POA: Diagnosis not present

## 2021-06-30 DIAGNOSIS — F331 Major depressive disorder, recurrent, moderate: Secondary | ICD-10-CM | POA: Diagnosis not present

## 2021-06-30 DIAGNOSIS — G8929 Other chronic pain: Secondary | ICD-10-CM | POA: Diagnosis not present

## 2021-06-30 DIAGNOSIS — E1169 Type 2 diabetes mellitus with other specified complication: Secondary | ICD-10-CM | POA: Diagnosis not present

## 2021-06-30 DIAGNOSIS — N1831 Chronic kidney disease, stage 3a: Secondary | ICD-10-CM | POA: Diagnosis not present

## 2021-07-12 ENCOUNTER — Ambulatory Visit
Admission: RE | Admit: 2021-07-12 | Discharge: 2021-07-12 | Disposition: A | Discharge status: Home / Self Care | Payer: PPO | Source: Ambulatory Visit | Attending: Physical Medicine and Rehabilitation | Admitting: Physical Medicine and Rehabilitation

## 2021-07-12 ENCOUNTER — Other Ambulatory Visit: Payer: Self-pay

## 2021-07-12 DIAGNOSIS — M4316 Spondylolisthesis, lumbar region: Secondary | ICD-10-CM | POA: Diagnosis not present

## 2021-07-12 DIAGNOSIS — M545 Low back pain, unspecified: Secondary | ICD-10-CM | POA: Diagnosis not present

## 2021-07-12 DIAGNOSIS — M961 Postlaminectomy syndrome, not elsewhere classified: Secondary | ICD-10-CM

## 2021-07-12 DIAGNOSIS — M47816 Spondylosis without myelopathy or radiculopathy, lumbar region: Secondary | ICD-10-CM | POA: Diagnosis not present

## 2021-07-12 DIAGNOSIS — M48061 Spinal stenosis, lumbar region without neurogenic claudication: Secondary | ICD-10-CM | POA: Diagnosis not present

## 2021-07-12 DIAGNOSIS — M2578 Osteophyte, vertebrae: Secondary | ICD-10-CM | POA: Diagnosis not present

## 2021-07-14 DIAGNOSIS — G8929 Other chronic pain: Secondary | ICD-10-CM | POA: Diagnosis not present

## 2021-07-14 DIAGNOSIS — F418 Other specified anxiety disorders: Secondary | ICD-10-CM | POA: Diagnosis not present

## 2021-07-14 DIAGNOSIS — N1831 Chronic kidney disease, stage 3a: Secondary | ICD-10-CM | POA: Diagnosis not present

## 2021-07-14 DIAGNOSIS — E1169 Type 2 diabetes mellitus with other specified complication: Secondary | ICD-10-CM | POA: Diagnosis not present

## 2021-07-14 DIAGNOSIS — E7800 Pure hypercholesterolemia, unspecified: Secondary | ICD-10-CM | POA: Diagnosis not present

## 2021-07-14 DIAGNOSIS — Z0000 Encounter for general adult medical examination without abnormal findings: Secondary | ICD-10-CM | POA: Diagnosis not present

## 2021-07-14 DIAGNOSIS — F331 Major depressive disorder, recurrent, moderate: Secondary | ICD-10-CM | POA: Diagnosis not present

## 2021-07-22 DIAGNOSIS — M48062 Spinal stenosis, lumbar region with neurogenic claudication: Secondary | ICD-10-CM | POA: Diagnosis not present

## 2021-07-30 DIAGNOSIS — G8929 Other chronic pain: Secondary | ICD-10-CM | POA: Diagnosis not present

## 2021-07-30 DIAGNOSIS — E785 Hyperlipidemia, unspecified: Secondary | ICD-10-CM | POA: Diagnosis not present

## 2021-07-30 DIAGNOSIS — F419 Anxiety disorder, unspecified: Secondary | ICD-10-CM | POA: Diagnosis not present

## 2021-07-30 DIAGNOSIS — K219 Gastro-esophageal reflux disease without esophagitis: Secondary | ICD-10-CM | POA: Diagnosis not present

## 2021-07-30 DIAGNOSIS — J309 Allergic rhinitis, unspecified: Secondary | ICD-10-CM | POA: Diagnosis not present

## 2021-07-30 DIAGNOSIS — E663 Overweight: Secondary | ICD-10-CM | POA: Diagnosis not present

## 2021-07-30 DIAGNOSIS — F3341 Major depressive disorder, recurrent, in partial remission: Secondary | ICD-10-CM | POA: Diagnosis not present

## 2021-07-30 DIAGNOSIS — K5900 Constipation, unspecified: Secondary | ICD-10-CM | POA: Diagnosis not present

## 2021-07-30 DIAGNOSIS — E1169 Type 2 diabetes mellitus with other specified complication: Secondary | ICD-10-CM | POA: Diagnosis not present

## 2021-07-30 DIAGNOSIS — M1990 Unspecified osteoarthritis, unspecified site: Secondary | ICD-10-CM | POA: Diagnosis not present

## 2021-07-31 DIAGNOSIS — N1831 Chronic kidney disease, stage 3a: Secondary | ICD-10-CM | POA: Diagnosis not present

## 2021-07-31 DIAGNOSIS — E1169 Type 2 diabetes mellitus with other specified complication: Secondary | ICD-10-CM | POA: Diagnosis not present

## 2021-07-31 DIAGNOSIS — G8929 Other chronic pain: Secondary | ICD-10-CM | POA: Diagnosis not present

## 2021-07-31 DIAGNOSIS — E7800 Pure hypercholesterolemia, unspecified: Secondary | ICD-10-CM | POA: Diagnosis not present

## 2021-07-31 DIAGNOSIS — K219 Gastro-esophageal reflux disease without esophagitis: Secondary | ICD-10-CM | POA: Diagnosis not present

## 2021-07-31 DIAGNOSIS — F331 Major depressive disorder, recurrent, moderate: Secondary | ICD-10-CM | POA: Diagnosis not present

## 2021-08-18 DIAGNOSIS — F1120 Opioid dependence, uncomplicated: Secondary | ICD-10-CM | POA: Diagnosis not present

## 2021-08-18 DIAGNOSIS — M48062 Spinal stenosis, lumbar region with neurogenic claudication: Secondary | ICD-10-CM | POA: Diagnosis not present

## 2021-08-18 DIAGNOSIS — Z79899 Other long term (current) drug therapy: Secondary | ICD-10-CM | POA: Diagnosis not present

## 2021-08-18 DIAGNOSIS — M961 Postlaminectomy syndrome, not elsewhere classified: Secondary | ICD-10-CM | POA: Diagnosis not present

## 2021-09-01 DIAGNOSIS — E1169 Type 2 diabetes mellitus with other specified complication: Secondary | ICD-10-CM | POA: Diagnosis not present

## 2021-09-01 DIAGNOSIS — F331 Major depressive disorder, recurrent, moderate: Secondary | ICD-10-CM | POA: Diagnosis not present

## 2021-09-01 DIAGNOSIS — N1831 Chronic kidney disease, stage 3a: Secondary | ICD-10-CM | POA: Diagnosis not present

## 2021-09-01 DIAGNOSIS — S0003XA Contusion of scalp, initial encounter: Secondary | ICD-10-CM | POA: Diagnosis not present

## 2021-09-01 DIAGNOSIS — G8929 Other chronic pain: Secondary | ICD-10-CM | POA: Diagnosis not present

## 2021-09-01 DIAGNOSIS — E7800 Pure hypercholesterolemia, unspecified: Secondary | ICD-10-CM | POA: Diagnosis not present

## 2021-09-01 DIAGNOSIS — K219 Gastro-esophageal reflux disease without esophagitis: Secondary | ICD-10-CM | POA: Diagnosis not present

## 2021-09-01 DIAGNOSIS — M549 Dorsalgia, unspecified: Secondary | ICD-10-CM | POA: Diagnosis not present

## 2021-09-28 DIAGNOSIS — F331 Major depressive disorder, recurrent, moderate: Secondary | ICD-10-CM | POA: Diagnosis not present

## 2021-09-28 DIAGNOSIS — E1169 Type 2 diabetes mellitus with other specified complication: Secondary | ICD-10-CM | POA: Diagnosis not present

## 2021-10-26 DIAGNOSIS — M7918 Myalgia, other site: Secondary | ICD-10-CM | POA: Diagnosis not present

## 2021-10-26 DIAGNOSIS — M791 Myalgia, unspecified site: Secondary | ICD-10-CM | POA: Diagnosis not present

## 2021-10-26 DIAGNOSIS — M542 Cervicalgia: Secondary | ICD-10-CM | POA: Diagnosis not present

## 2021-10-26 DIAGNOSIS — M461 Sacroiliitis, not elsewhere classified: Secondary | ICD-10-CM | POA: Diagnosis not present

## 2021-10-28 DIAGNOSIS — E1169 Type 2 diabetes mellitus with other specified complication: Secondary | ICD-10-CM | POA: Diagnosis not present

## 2021-10-28 DIAGNOSIS — K219 Gastro-esophageal reflux disease without esophagitis: Secondary | ICD-10-CM | POA: Diagnosis not present

## 2021-10-28 DIAGNOSIS — E782 Mixed hyperlipidemia: Secondary | ICD-10-CM | POA: Diagnosis not present

## 2021-10-28 DIAGNOSIS — F331 Major depressive disorder, recurrent, moderate: Secondary | ICD-10-CM | POA: Diagnosis not present

## 2021-11-27 DIAGNOSIS — F331 Major depressive disorder, recurrent, moderate: Secondary | ICD-10-CM | POA: Diagnosis not present

## 2021-11-27 DIAGNOSIS — E782 Mixed hyperlipidemia: Secondary | ICD-10-CM | POA: Diagnosis not present

## 2021-11-27 DIAGNOSIS — G8929 Other chronic pain: Secondary | ICD-10-CM | POA: Diagnosis not present

## 2021-12-29 DIAGNOSIS — E782 Mixed hyperlipidemia: Secondary | ICD-10-CM | POA: Diagnosis not present

## 2021-12-29 DIAGNOSIS — G8929 Other chronic pain: Secondary | ICD-10-CM | POA: Diagnosis not present

## 2021-12-29 DIAGNOSIS — K219 Gastro-esophageal reflux disease without esophagitis: Secondary | ICD-10-CM | POA: Diagnosis not present

## 2021-12-29 DIAGNOSIS — E1169 Type 2 diabetes mellitus with other specified complication: Secondary | ICD-10-CM | POA: Diagnosis not present

## 2021-12-29 DIAGNOSIS — N1831 Chronic kidney disease, stage 3a: Secondary | ICD-10-CM | POA: Diagnosis not present

## 2021-12-29 DIAGNOSIS — F331 Major depressive disorder, recurrent, moderate: Secondary | ICD-10-CM | POA: Diagnosis not present

## 2022-01-13 DIAGNOSIS — G8929 Other chronic pain: Secondary | ICD-10-CM | POA: Diagnosis not present

## 2022-01-13 DIAGNOSIS — E1169 Type 2 diabetes mellitus with other specified complication: Secondary | ICD-10-CM | POA: Diagnosis not present

## 2022-01-13 DIAGNOSIS — F418 Other specified anxiety disorders: Secondary | ICD-10-CM | POA: Diagnosis not present

## 2022-01-13 DIAGNOSIS — N1831 Chronic kidney disease, stage 3a: Secondary | ICD-10-CM | POA: Diagnosis not present

## 2022-01-13 DIAGNOSIS — E7800 Pure hypercholesterolemia, unspecified: Secondary | ICD-10-CM | POA: Diagnosis not present

## 2022-01-26 DIAGNOSIS — G8929 Other chronic pain: Secondary | ICD-10-CM | POA: Diagnosis not present

## 2022-01-26 DIAGNOSIS — N1831 Chronic kidney disease, stage 3a: Secondary | ICD-10-CM | POA: Diagnosis not present

## 2022-01-26 DIAGNOSIS — F331 Major depressive disorder, recurrent, moderate: Secondary | ICD-10-CM | POA: Diagnosis not present

## 2022-01-26 DIAGNOSIS — K219 Gastro-esophageal reflux disease without esophagitis: Secondary | ICD-10-CM | POA: Diagnosis not present

## 2022-01-26 DIAGNOSIS — E1169 Type 2 diabetes mellitus with other specified complication: Secondary | ICD-10-CM | POA: Diagnosis not present

## 2022-01-26 DIAGNOSIS — E782 Mixed hyperlipidemia: Secondary | ICD-10-CM | POA: Diagnosis not present

## 2022-02-24 DIAGNOSIS — M5416 Radiculopathy, lumbar region: Secondary | ICD-10-CM | POA: Diagnosis not present

## 2022-02-24 DIAGNOSIS — M961 Postlaminectomy syndrome, not elsewhere classified: Secondary | ICD-10-CM | POA: Insufficient documentation

## 2022-03-18 DIAGNOSIS — M5416 Radiculopathy, lumbar region: Secondary | ICD-10-CM | POA: Diagnosis not present

## 2022-03-31 DIAGNOSIS — K219 Gastro-esophageal reflux disease without esophagitis: Secondary | ICD-10-CM | POA: Diagnosis not present

## 2022-03-31 DIAGNOSIS — F331 Major depressive disorder, recurrent, moderate: Secondary | ICD-10-CM | POA: Diagnosis not present

## 2022-03-31 DIAGNOSIS — E782 Mixed hyperlipidemia: Secondary | ICD-10-CM | POA: Diagnosis not present

## 2022-03-31 DIAGNOSIS — G8929 Other chronic pain: Secondary | ICD-10-CM | POA: Diagnosis not present

## 2022-03-31 DIAGNOSIS — E1169 Type 2 diabetes mellitus with other specified complication: Secondary | ICD-10-CM | POA: Diagnosis not present

## 2022-03-31 DIAGNOSIS — N1831 Chronic kidney disease, stage 3a: Secondary | ICD-10-CM | POA: Diagnosis not present

## 2022-04-08 ENCOUNTER — Other Ambulatory Visit: Payer: Self-pay | Admitting: Internal Medicine

## 2022-04-08 DIAGNOSIS — Z1231 Encounter for screening mammogram for malignant neoplasm of breast: Secondary | ICD-10-CM

## 2022-04-27 DIAGNOSIS — N1831 Chronic kidney disease, stage 3a: Secondary | ICD-10-CM | POA: Diagnosis not present

## 2022-04-27 DIAGNOSIS — G8929 Other chronic pain: Secondary | ICD-10-CM | POA: Diagnosis not present

## 2022-04-27 DIAGNOSIS — E782 Mixed hyperlipidemia: Secondary | ICD-10-CM | POA: Diagnosis not present

## 2022-04-27 DIAGNOSIS — K219 Gastro-esophageal reflux disease without esophagitis: Secondary | ICD-10-CM | POA: Diagnosis not present

## 2022-04-27 DIAGNOSIS — F331 Major depressive disorder, recurrent, moderate: Secondary | ICD-10-CM | POA: Diagnosis not present

## 2022-04-27 DIAGNOSIS — E1169 Type 2 diabetes mellitus with other specified complication: Secondary | ICD-10-CM | POA: Diagnosis not present

## 2022-04-29 ENCOUNTER — Ambulatory Visit
Admission: RE | Admit: 2022-04-29 | Discharge: 2022-04-29 | Disposition: A | Discharge status: Home / Self Care | Payer: PPO | Source: Ambulatory Visit | Attending: Internal Medicine | Admitting: Internal Medicine

## 2022-04-29 DIAGNOSIS — Z1231 Encounter for screening mammogram for malignant neoplasm of breast: Secondary | ICD-10-CM | POA: Diagnosis not present

## 2022-05-06 DIAGNOSIS — H0102A Squamous blepharitis right eye, upper and lower eyelids: Secondary | ICD-10-CM | POA: Diagnosis not present

## 2022-05-06 DIAGNOSIS — Z961 Presence of intraocular lens: Secondary | ICD-10-CM | POA: Diagnosis not present

## 2022-05-06 DIAGNOSIS — E119 Type 2 diabetes mellitus without complications: Secondary | ICD-10-CM | POA: Diagnosis not present

## 2022-05-06 DIAGNOSIS — H0102B Squamous blepharitis left eye, upper and lower eyelids: Secondary | ICD-10-CM | POA: Diagnosis not present

## 2022-05-11 DIAGNOSIS — M5416 Radiculopathy, lumbar region: Secondary | ICD-10-CM | POA: Diagnosis not present

## 2022-06-01 DIAGNOSIS — Z23 Encounter for immunization: Secondary | ICD-10-CM | POA: Diagnosis not present

## 2022-06-29 DIAGNOSIS — L089 Local infection of the skin and subcutaneous tissue, unspecified: Secondary | ICD-10-CM | POA: Diagnosis not present

## 2022-06-29 DIAGNOSIS — N952 Postmenopausal atrophic vaginitis: Secondary | ICD-10-CM | POA: Diagnosis not present

## 2022-06-29 DIAGNOSIS — N898 Other specified noninflammatory disorders of vagina: Secondary | ICD-10-CM | POA: Diagnosis not present

## 2022-07-01 DIAGNOSIS — M5416 Radiculopathy, lumbar region: Secondary | ICD-10-CM | POA: Diagnosis not present

## 2022-07-15 DIAGNOSIS — G8929 Other chronic pain: Secondary | ICD-10-CM | POA: Diagnosis not present

## 2022-07-15 DIAGNOSIS — F418 Other specified anxiety disorders: Secondary | ICD-10-CM | POA: Diagnosis not present

## 2022-07-15 DIAGNOSIS — Z23 Encounter for immunization: Secondary | ICD-10-CM | POA: Diagnosis not present

## 2022-07-15 DIAGNOSIS — Z0000 Encounter for general adult medical examination without abnormal findings: Secondary | ICD-10-CM | POA: Diagnosis not present

## 2022-07-15 DIAGNOSIS — F324 Major depressive disorder, single episode, in partial remission: Secondary | ICD-10-CM | POA: Diagnosis not present

## 2022-07-15 DIAGNOSIS — E1169 Type 2 diabetes mellitus with other specified complication: Secondary | ICD-10-CM | POA: Diagnosis not present

## 2022-07-15 DIAGNOSIS — E7800 Pure hypercholesterolemia, unspecified: Secondary | ICD-10-CM | POA: Diagnosis not present

## 2022-07-15 DIAGNOSIS — E0829 Diabetes mellitus due to underlying condition with other diabetic kidney complication: Secondary | ICD-10-CM | POA: Diagnosis not present

## 2022-07-15 DIAGNOSIS — K5900 Constipation, unspecified: Secondary | ICD-10-CM | POA: Diagnosis not present

## 2022-07-15 DIAGNOSIS — N1831 Chronic kidney disease, stage 3a: Secondary | ICD-10-CM | POA: Diagnosis not present

## 2022-08-11 DIAGNOSIS — M5416 Radiculopathy, lumbar region: Secondary | ICD-10-CM | POA: Diagnosis not present

## 2022-08-11 DIAGNOSIS — G894 Chronic pain syndrome: Secondary | ICD-10-CM | POA: Diagnosis not present

## 2022-08-11 DIAGNOSIS — M545 Low back pain, unspecified: Secondary | ICD-10-CM | POA: Diagnosis not present

## 2022-08-11 DIAGNOSIS — Z79899 Other long term (current) drug therapy: Secondary | ICD-10-CM | POA: Diagnosis not present

## 2022-08-11 DIAGNOSIS — Z5181 Encounter for therapeutic drug level monitoring: Secondary | ICD-10-CM | POA: Diagnosis not present

## 2022-08-11 DIAGNOSIS — M961 Postlaminectomy syndrome, not elsewhere classified: Secondary | ICD-10-CM | POA: Diagnosis not present

## 2022-09-02 DIAGNOSIS — M5416 Radiculopathy, lumbar region: Secondary | ICD-10-CM | POA: Diagnosis not present

## 2022-09-03 DIAGNOSIS — N952 Postmenopausal atrophic vaginitis: Secondary | ICD-10-CM | POA: Diagnosis not present

## 2022-09-03 DIAGNOSIS — L292 Pruritus vulvae: Secondary | ICD-10-CM | POA: Diagnosis not present

## 2022-09-23 DIAGNOSIS — M961 Postlaminectomy syndrome, not elsewhere classified: Secondary | ICD-10-CM | POA: Diagnosis not present

## 2022-09-23 DIAGNOSIS — G894 Chronic pain syndrome: Secondary | ICD-10-CM | POA: Diagnosis not present

## 2022-09-23 DIAGNOSIS — M48061 Spinal stenosis, lumbar region without neurogenic claudication: Secondary | ICD-10-CM | POA: Insufficient documentation

## 2022-09-23 DIAGNOSIS — M48062 Spinal stenosis, lumbar region with neurogenic claudication: Secondary | ICD-10-CM | POA: Diagnosis not present

## 2022-09-23 DIAGNOSIS — M545 Low back pain, unspecified: Secondary | ICD-10-CM | POA: Diagnosis not present

## 2022-10-12 DIAGNOSIS — M5451 Vertebrogenic low back pain: Secondary | ICD-10-CM | POA: Diagnosis not present

## 2022-10-15 DIAGNOSIS — M47816 Spondylosis without myelopathy or radiculopathy, lumbar region: Secondary | ICD-10-CM | POA: Diagnosis not present

## 2022-10-28 DIAGNOSIS — M47816 Spondylosis without myelopathy or radiculopathy, lumbar region: Secondary | ICD-10-CM | POA: Diagnosis not present

## 2022-11-13 DIAGNOSIS — M47816 Spondylosis without myelopathy or radiculopathy, lumbar region: Secondary | ICD-10-CM | POA: Diagnosis not present

## 2023-01-13 DIAGNOSIS — E1169 Type 2 diabetes mellitus with other specified complication: Secondary | ICD-10-CM | POA: Diagnosis not present

## 2023-01-13 DIAGNOSIS — N1831 Chronic kidney disease, stage 3a: Secondary | ICD-10-CM | POA: Diagnosis not present

## 2023-01-13 DIAGNOSIS — E1122 Type 2 diabetes mellitus with diabetic chronic kidney disease: Secondary | ICD-10-CM | POA: Diagnosis not present

## 2023-01-13 DIAGNOSIS — E7800 Pure hypercholesterolemia, unspecified: Secondary | ICD-10-CM | POA: Diagnosis not present

## 2023-01-13 DIAGNOSIS — F418 Other specified anxiety disorders: Secondary | ICD-10-CM | POA: Diagnosis not present

## 2023-01-13 DIAGNOSIS — Z23 Encounter for immunization: Secondary | ICD-10-CM | POA: Diagnosis not present

## 2023-01-13 DIAGNOSIS — E0829 Diabetes mellitus due to underlying condition with other diabetic kidney complication: Secondary | ICD-10-CM | POA: Diagnosis not present

## 2023-01-13 DIAGNOSIS — G8929 Other chronic pain: Secondary | ICD-10-CM | POA: Diagnosis not present

## 2023-01-13 DIAGNOSIS — F331 Major depressive disorder, recurrent, moderate: Secondary | ICD-10-CM | POA: Diagnosis not present

## 2023-01-14 DIAGNOSIS — M47816 Spondylosis without myelopathy or radiculopathy, lumbar region: Secondary | ICD-10-CM | POA: Diagnosis not present

## 2023-01-27 DIAGNOSIS — G894 Chronic pain syndrome: Secondary | ICD-10-CM | POA: Diagnosis not present

## 2023-01-27 DIAGNOSIS — M79606 Pain in leg, unspecified: Secondary | ICD-10-CM | POA: Diagnosis not present

## 2023-01-27 DIAGNOSIS — M5416 Radiculopathy, lumbar region: Secondary | ICD-10-CM | POA: Diagnosis not present

## 2023-01-27 DIAGNOSIS — M961 Postlaminectomy syndrome, not elsewhere classified: Secondary | ICD-10-CM | POA: Diagnosis not present

## 2023-01-27 DIAGNOSIS — M48062 Spinal stenosis, lumbar region with neurogenic claudication: Secondary | ICD-10-CM | POA: Diagnosis not present

## 2023-02-12 DIAGNOSIS — M5416 Radiculopathy, lumbar region: Secondary | ICD-10-CM | POA: Diagnosis not present

## 2023-02-26 DIAGNOSIS — M961 Postlaminectomy syndrome, not elsewhere classified: Secondary | ICD-10-CM | POA: Diagnosis not present

## 2023-02-26 DIAGNOSIS — M5459 Other low back pain: Secondary | ICD-10-CM | POA: Diagnosis not present

## 2023-02-26 DIAGNOSIS — M5416 Radiculopathy, lumbar region: Secondary | ICD-10-CM | POA: Diagnosis not present

## 2023-03-14 DIAGNOSIS — M5459 Other low back pain: Secondary | ICD-10-CM | POA: Diagnosis not present

## 2023-03-21 DIAGNOSIS — M5451 Vertebrogenic low back pain: Secondary | ICD-10-CM | POA: Diagnosis not present

## 2023-03-24 DIAGNOSIS — M5416 Radiculopathy, lumbar region: Secondary | ICD-10-CM | POA: Diagnosis not present

## 2023-03-31 ENCOUNTER — Other Ambulatory Visit: Payer: Self-pay | Admitting: Internal Medicine

## 2023-03-31 DIAGNOSIS — Z1231 Encounter for screening mammogram for malignant neoplasm of breast: Secondary | ICD-10-CM

## 2023-04-14 DIAGNOSIS — M5451 Vertebrogenic low back pain: Secondary | ICD-10-CM | POA: Diagnosis not present

## 2023-05-02 ENCOUNTER — Ambulatory Visit: Payer: PPO

## 2023-05-03 DIAGNOSIS — M5416 Radiculopathy, lumbar region: Secondary | ICD-10-CM | POA: Diagnosis not present

## 2023-05-19 ENCOUNTER — Ambulatory Visit: Payer: PPO

## 2023-05-23 ENCOUNTER — Ambulatory Visit
Admission: RE | Admit: 2023-05-23 | Discharge: 2023-05-23 | Disposition: A | Discharge status: Home / Self Care | Payer: HMO | Source: Ambulatory Visit | Attending: Internal Medicine | Admitting: Internal Medicine

## 2023-05-23 DIAGNOSIS — Z1231 Encounter for screening mammogram for malignant neoplasm of breast: Secondary | ICD-10-CM | POA: Diagnosis not present

## 2023-05-31 DIAGNOSIS — M791 Myalgia, unspecified site: Secondary | ICD-10-CM | POA: Diagnosis not present

## 2023-05-31 DIAGNOSIS — M5451 Vertebrogenic low back pain: Secondary | ICD-10-CM | POA: Diagnosis not present

## 2023-05-31 DIAGNOSIS — M7918 Myalgia, other site: Secondary | ICD-10-CM | POA: Diagnosis not present

## 2023-07-19 DIAGNOSIS — H0102A Squamous blepharitis right eye, upper and lower eyelids: Secondary | ICD-10-CM | POA: Diagnosis not present

## 2023-07-19 DIAGNOSIS — Z961 Presence of intraocular lens: Secondary | ICD-10-CM | POA: Diagnosis not present

## 2023-07-19 DIAGNOSIS — E119 Type 2 diabetes mellitus without complications: Secondary | ICD-10-CM | POA: Diagnosis not present

## 2023-07-19 DIAGNOSIS — H0102B Squamous blepharitis left eye, upper and lower eyelids: Secondary | ICD-10-CM | POA: Diagnosis not present

## 2023-07-21 ENCOUNTER — Other Ambulatory Visit: Payer: Self-pay | Admitting: Internal Medicine

## 2023-07-21 DIAGNOSIS — Z0000 Encounter for general adult medical examination without abnormal findings: Secondary | ICD-10-CM | POA: Diagnosis not present

## 2023-07-21 DIAGNOSIS — E7800 Pure hypercholesterolemia, unspecified: Secondary | ICD-10-CM | POA: Diagnosis not present

## 2023-07-21 DIAGNOSIS — F324 Major depressive disorder, single episode, in partial remission: Secondary | ICD-10-CM | POA: Diagnosis not present

## 2023-07-21 DIAGNOSIS — E0829 Diabetes mellitus due to underlying condition with other diabetic kidney complication: Secondary | ICD-10-CM | POA: Diagnosis not present

## 2023-07-21 DIAGNOSIS — M8580 Other specified disorders of bone density and structure, unspecified site: Secondary | ICD-10-CM

## 2023-07-21 DIAGNOSIS — Z1331 Encounter for screening for depression: Secondary | ICD-10-CM | POA: Diagnosis not present

## 2023-07-21 DIAGNOSIS — N1831 Chronic kidney disease, stage 3a: Secondary | ICD-10-CM | POA: Diagnosis not present

## 2023-07-21 DIAGNOSIS — F418 Other specified anxiety disorders: Secondary | ICD-10-CM | POA: Diagnosis not present

## 2023-07-21 DIAGNOSIS — G8929 Other chronic pain: Secondary | ICD-10-CM | POA: Diagnosis not present

## 2023-07-21 DIAGNOSIS — Z23 Encounter for immunization: Secondary | ICD-10-CM | POA: Diagnosis not present

## 2023-07-21 DIAGNOSIS — Z5181 Encounter for therapeutic drug level monitoring: Secondary | ICD-10-CM | POA: Diagnosis not present

## 2023-10-18 DIAGNOSIS — M5416 Radiculopathy, lumbar region: Secondary | ICD-10-CM | POA: Diagnosis not present

## 2023-10-18 DIAGNOSIS — M791 Myalgia, unspecified site: Secondary | ICD-10-CM | POA: Diagnosis not present

## 2023-10-18 DIAGNOSIS — M47896 Other spondylosis, lumbar region: Secondary | ICD-10-CM | POA: Diagnosis not present

## 2023-11-07 DIAGNOSIS — M47816 Spondylosis without myelopathy or radiculopathy, lumbar region: Secondary | ICD-10-CM | POA: Diagnosis not present

## 2023-11-07 DIAGNOSIS — M5416 Radiculopathy, lumbar region: Secondary | ICD-10-CM | POA: Diagnosis not present

## 2023-11-07 DIAGNOSIS — M5451 Vertebrogenic low back pain: Secondary | ICD-10-CM | POA: Diagnosis not present

## 2023-11-07 DIAGNOSIS — M791 Myalgia, unspecified site: Secondary | ICD-10-CM | POA: Diagnosis not present

## 2023-11-09 DIAGNOSIS — E1169 Type 2 diabetes mellitus with other specified complication: Secondary | ICD-10-CM | POA: Diagnosis not present

## 2023-11-10 DIAGNOSIS — M5416 Radiculopathy, lumbar region: Secondary | ICD-10-CM | POA: Diagnosis not present

## 2023-12-06 ENCOUNTER — Other Ambulatory Visit: Payer: Self-pay | Admitting: Specialist

## 2023-12-06 DIAGNOSIS — M48062 Spinal stenosis, lumbar region with neurogenic claudication: Secondary | ICD-10-CM

## 2023-12-13 ENCOUNTER — Ambulatory Visit
Admission: RE | Admit: 2023-12-13 | Discharge: 2023-12-13 | Disposition: A | Discharge status: Home / Self Care | Source: Ambulatory Visit | Attending: Specialist | Admitting: Specialist

## 2023-12-13 DIAGNOSIS — M48062 Spinal stenosis, lumbar region with neurogenic claudication: Secondary | ICD-10-CM

## 2023-12-13 DIAGNOSIS — Z981 Arthrodesis status: Secondary | ICD-10-CM | POA: Diagnosis not present

## 2023-12-13 MED ORDER — IOPAMIDOL (ISOVUE-M 200) INJECTION 41%
18.0000 mL | Freq: Once | INTRAMUSCULAR | Status: AC
  Administered 2023-12-13: 18 mL via INTRATHECAL

## 2023-12-13 MED ORDER — MEPERIDINE HCL 50 MG/ML IJ SOLN: 50.0000 mg | Freq: Once | INTRAMUSCULAR | Status: DC | PRN

## 2023-12-13 MED ORDER — ONDANSETRON HCL 4 MG/2ML IJ SOLN: 4.0000 mg | Freq: Once | INTRAMUSCULAR | Status: DC | PRN

## 2023-12-13 MED ORDER — DIAZEPAM 5 MG PO TABS: 5.0000 mg | ORAL_TABLET | Freq: Once | ORAL | Status: DC

## 2023-12-13 NOTE — Discharge Instructions (Signed)

## 2023-12-22 DIAGNOSIS — M545 Low back pain, unspecified: Secondary | ICD-10-CM | POA: Diagnosis not present

## 2023-12-22 DIAGNOSIS — M47896 Other spondylosis, lumbar region: Secondary | ICD-10-CM | POA: Diagnosis not present

## 2023-12-22 DIAGNOSIS — M5416 Radiculopathy, lumbar region: Secondary | ICD-10-CM | POA: Diagnosis not present

## 2023-12-22 DIAGNOSIS — M791 Myalgia, unspecified site: Secondary | ICD-10-CM | POA: Diagnosis not present

## 2023-12-22 DIAGNOSIS — M25561 Pain in right knee: Secondary | ICD-10-CM | POA: Diagnosis not present

## 2023-12-29 DIAGNOSIS — M47896 Other spondylosis, lumbar region: Secondary | ICD-10-CM | POA: Diagnosis not present

## 2023-12-29 DIAGNOSIS — M48062 Spinal stenosis, lumbar region with neurogenic claudication: Secondary | ICD-10-CM | POA: Diagnosis not present

## 2023-12-29 DIAGNOSIS — M961 Postlaminectomy syndrome, not elsewhere classified: Secondary | ICD-10-CM | POA: Diagnosis not present

## 2023-12-29 DIAGNOSIS — M5416 Radiculopathy, lumbar region: Secondary | ICD-10-CM | POA: Diagnosis not present

## 2023-12-29 DIAGNOSIS — G894 Chronic pain syndrome: Secondary | ICD-10-CM | POA: Diagnosis not present

## 2023-12-31 DIAGNOSIS — M5416 Radiculopathy, lumbar region: Secondary | ICD-10-CM | POA: Diagnosis not present

## 2024-01-19 DIAGNOSIS — E0829 Diabetes mellitus due to underlying condition with other diabetic kidney complication: Secondary | ICD-10-CM | POA: Diagnosis not present

## 2024-01-19 DIAGNOSIS — R9431 Abnormal electrocardiogram [ECG] [EKG]: Secondary | ICD-10-CM | POA: Diagnosis not present

## 2024-01-19 DIAGNOSIS — E7800 Pure hypercholesterolemia, unspecified: Secondary | ICD-10-CM | POA: Diagnosis not present

## 2024-01-19 DIAGNOSIS — G8929 Other chronic pain: Secondary | ICD-10-CM | POA: Diagnosis not present

## 2024-01-19 DIAGNOSIS — F324 Major depressive disorder, single episode, in partial remission: Secondary | ICD-10-CM | POA: Diagnosis not present

## 2024-01-19 DIAGNOSIS — R600 Localized edema: Secondary | ICD-10-CM | POA: Diagnosis not present

## 2024-01-19 DIAGNOSIS — F418 Other specified anxiety disorders: Secondary | ICD-10-CM | POA: Diagnosis not present

## 2024-01-19 DIAGNOSIS — N1831 Chronic kidney disease, stage 3a: Secondary | ICD-10-CM | POA: Diagnosis not present

## 2024-01-20 ENCOUNTER — Other Ambulatory Visit (HOSPITAL_BASED_OUTPATIENT_CLINIC_OR_DEPARTMENT_OTHER): Payer: Self-pay | Admitting: Internal Medicine

## 2024-01-20 ENCOUNTER — Telehealth (HOSPITAL_COMMUNITY): Payer: Self-pay

## 2024-01-20 DIAGNOSIS — R600 Localized edema: Secondary | ICD-10-CM

## 2024-01-20 NOTE — Telephone Encounter (Signed)
 Attempted to contact the patient to schedule VAS [place]. No answer. Left message. First Attempt Provided direct contact number for scheduling: [phone].

## 2024-01-21 DIAGNOSIS — M961 Postlaminectomy syndrome, not elsewhere classified: Secondary | ICD-10-CM | POA: Diagnosis not present

## 2024-01-23 ENCOUNTER — Ambulatory Visit (HOSPITAL_COMMUNITY)
Admission: RE | Admit: 2024-01-23 | Discharge: 2024-01-23 | Disposition: A | Discharge status: Home / Self Care | Source: Ambulatory Visit | Attending: Surgery | Admitting: Surgery

## 2024-01-23 DIAGNOSIS — R600 Localized edema: Secondary | ICD-10-CM | POA: Diagnosis not present

## 2024-01-25 DIAGNOSIS — R9431 Abnormal electrocardiogram [ECG] [EKG]: Secondary | ICD-10-CM | POA: Diagnosis not present

## 2024-02-08 ENCOUNTER — Ambulatory Visit: Payer: Self-pay | Admitting: Orthopedic Surgery

## 2024-02-08 DIAGNOSIS — M48062 Spinal stenosis, lumbar region with neurogenic claudication: Secondary | ICD-10-CM

## 2024-02-14 DIAGNOSIS — M47816 Spondylosis without myelopathy or radiculopathy, lumbar region: Secondary | ICD-10-CM | POA: Diagnosis not present

## 2024-02-14 DIAGNOSIS — M5416 Radiculopathy, lumbar region: Secondary | ICD-10-CM | POA: Diagnosis not present

## 2024-02-14 DIAGNOSIS — M791 Myalgia, unspecified site: Secondary | ICD-10-CM | POA: Diagnosis not present

## 2024-03-02 ENCOUNTER — Ambulatory Visit: Payer: Self-pay | Admitting: Orthopedic Surgery

## 2024-03-02 NOTE — H&P (View-Only) (Signed)
 Debbie Montoya is an 88 y.o. female.   Chief Complaint: back and leg pain HPI: Reason for Visit: (normal) visit for: (back) Context: years Location (Lower Extremity): lower back pain ; leg pain on the right, , Severity: pain level 8/10 Timing: worse during the night Aggravating Factors: standing for ; walking for Medications: Hydrocodone  prescribed by Dr. Bonner Notes: Patient is scheduled for a T-F ESI L3-4 R on 03/15/24 Surgery is upcoming she is having pain down her leg. She is prescribed hydrocodone  4 times a day max  Past Medical History:  Diagnosis Date   Anxiety    Arthritis    lumbar spondylosis   Colitis    Diabetes mellitus    diet controlled   Fever blister    GERD (gastroesophageal reflux disease)    Neuromuscular disorder (HCC)    nerve pain on L leg from back    Pancreatitis    1980's, pt. reports that she was hospitalized for 3 weeks, [facility]   PONV (postoperative nausea and vomiting)    on previous back surgery, didn't last long   Stroke (HCC)    2012- seen in ER for supposed , everything checked out OK     Past Surgical History:  Procedure Laterality Date   ABDOMINAL HYSTERECTOMY     ANTERIOR CERVICAL DECOMP/DISCECTOMY FUSION N/A 11/18/2015   Procedure: C5-6 C6-7 Anterior cervical decompression/diskectomy/fusion;  Surgeon: Rockey Peru, MD;  Location: [facility];  Service: Neurosurgery;  Laterality: N/A;  C5-6 C6-7 Anterior cervical decompression/diskectomy/fusion   APPENDECTOMY     BACK SURGERY     CATARACT EXTRACTION  01/10/2017   CHOLECYSTECTOMY     COLONOSCOPY WITH PROPOFOL  N/A 04/02/2014   Procedure: COLONOSCOPY WITH PROPOFOL ;  Surgeon: Gladis MARLA Louder, MD;  Location: [facility];  Service: Endoscopy;  Laterality: N/A;   DILATION AND CURETTAGE OF UTERUS     TONSILLECTOMY      Family History  Problem Relation Age of Onset   Cancer Father        prostate   COPD Mother    Breast cancer Maternal Aunt    Breast cancer Cousin    Social History:   reports that she has never smoked. She has never used smokeless tobacco. She reports current alcohol use. She reports that she does not use drugs.  Allergies:  Allergies  Allergen Reactions   Tape Other (See Comments)    Tender skin   Penicillins Swelling and Rash    Has patient had a PCN reaction causing immediate rash, facial/tongue/throat swelling, SOB or lightheadedness with hypotension: Unknown Has patient had a PCN reaction causing severe rash involving mucus membranes or skin necrosis: No Has patient had a PCN reaction that required hospitalization No Has patient had a PCN reaction occurring within the last 10 years: No If all of the above answers are NO, then may proceed with Cephalosporin use.    Current meds: buPROPion  HCL XL 300 mg 24 hr tablet, extended release fenofibrate  160 mg tablet FLUoxetine  40 mg capsule gabapentin  300 mg capsule HYDROcodone  10 mg-acetaminophen  325 mg tablet omeprazole 20 mg capsule,delayed release promethazine  12.5 mg tablet Xanax   Review of Systems  Constitutional: Negative.   HENT: Negative.    Eyes: Negative.   Respiratory: Negative.    Cardiovascular: Negative.   Gastrointestinal: Negative.   Endocrine: Negative.   Genitourinary: Negative.   Musculoskeletal:  Positive for back pain and gait problem.  Neurological:  Positive for weakness and numbness.  Psychiatric/Behavioral: Negative.  There were no vitals taken for this visit. Physical Exam Constitutional:      Appearance: Normal appearance.  HENT:     Head: Normocephalic and atraumatic.     Right Ear: External ear normal.     Left Ear: External ear normal.     Nose: Nose normal.     Mouth/Throat:     Pharynx: Oropharynx is clear.  Eyes:     Conjunctiva/sclera: Conjunctivae normal.  Cardiovascular:     Rate and Rhythm: Normal rate.     Pulses: Normal pulses.     Heart sounds: Normal heart sounds.  Pulmonary:     Effort: Pulmonary effort is normal.     Breath  sounds: Normal breath sounds.  Abdominal:     General: Bowel sounds are normal.  Musculoskeletal:     Cervical back: Normal range of motion.     Comments: Straight leg raising on the right is buttock thigh and calf pain. The right proximal gluteus  Skin:    General: Skin is warm and dry.  Neurological:     Mental Status: She is alert.    X-rays of the knee right demonstrates medial joint space narrowing  X-rays of the pelvis demonstrates no fracture or severe osteoarthritis   Flexion-extension radiographs demonstrate a slight spondylolisthesis at L3-4 and instrumented fusion at L4-5 with no hardware failure. End-stage disc degeneration L5-S1.  MRI demonstrates severe central spinal stenosis at L3-4. With facet effusions and facet arthropathy.  CT myelogram demonstrates moderately severe stenosis at L3-4. Though she reports she was on a tilt table did not fully weight-bear and extend her spine.  Assessment/Plan Impression:  1. Neurogenic claudication secondary to spinal stenosis L3-4 at the adjacent segment of the fusion at L4-5 that has been refractory to injections including selective nerve root blocks at L4 and medial branch blocks of L4 and L5. MRI indicating severe central canal stenosis she has right greater than left symptoms. Weakness in the right lower extremity temperature relief from an epidural 2. Local myofascial trigger point right proximal gluteus  Plan:  Proceeded with local myofascial trigger point right proximal gluteus  We discussed living with her symptoms versus surgery. The MRI definitely shows severe lateral recess stenosis at L3-4 bilaterally. Less so on the CT myelogram she reports she was on the tilt table and there is definitely some ligamentum flavum hypertrophy. She does have L3-L4 nerve root symptomatology and temporary relief from an epidural. She reports she does not able to live with her symptoms.   I had a discussion with the patient concerning  their pathology, relevant anatomy and treatment options.  I had an extensive discussion with the patient concerning the pathology relevant anatomy and treatment options. At this point exhausting conservative treatment and in the presence of a neurologic deficit we discussed microlumbar decompression. I discussed the risks and benefits including bleeding, infection, DVT, PE, anesthetic complications, worsening in their symptoms, improvement in their symptoms, C SF leakage, epidural fibrosis, need for future surgeries such as revision discectomy and lumbar fusion. I also indicated that this is an operation to basically decompress the nerve roots to allow recovery as opposed to fixing a herniated disc if it is encountered and that the incidence of recurrent chest disc herniation can approach 15%. Also that nerve root recovery is variable and may not recover completely. Any ligament or bone that is contributing to compressing the nerves will be removed as well.  I discussed the operative course including overnight in the hospital. Immediate  ambulation. Follow-up in 2 weeks for suture removal. 6 weeks until healing of the herniation and surgical incision followed by 6 weeks of reconditioning and strengthening of the core musculature. Also discussed the need to employ the concepts of disc pressure management and core motion following the surgery to minimize the risk of recurrent disc herniation. We will obtain preoperative clearance i if necessary and proceed accordingly.   Patient was provided a lumbar brace previously. This is indicated to reduce pain by restricting mobility of the spine and to facilitate healing of any injured tissues. In addition it is indicated to support weak paraspinous musculature or a deformity of the spine.   Patient is to call or present to the emergency room if there is any numbness or weakness to suggest worsening of their condition. In addition although rare if there is loss of  bowel or bladder function such as incontinence or inability to void that this may represent a cauda equina syndrome. If noted the patient is to present immediately to the emergency room for evaluation and treatment otherwise permanent bowel or bladder dysfunction can occur as a result.  No history of DVT or MRSA.SABRA Norco or oxycodone  postoperatively. Preoperative clearance.  Plan central laminectomy L3-4  Darice CHRISTELLA Randy, PA-C for Dr Duwayne 03/02/2024, 8:57 AM

## 2024-03-02 NOTE — H&P (Signed)
 Debbie Montoya is an 88 y.o. female.   Chief Complaint: back and leg pain HPI: Reason for Visit: (normal) visit for: (back) Context: years Location (Lower Extremity): lower back pain ; leg pain on the right, , Severity: pain level 8/10 Timing: worse during the night Aggravating Factors: standing for ; walking for Medications: Hydrocodone  prescribed by Dr. Bonner Notes: Patient is scheduled for a T-F ESI L3-4 R on 03/15/24 Surgery is upcoming she is having pain down her leg. She is prescribed hydrocodone  4 times a day max  Past Medical History:  Diagnosis Date   Anxiety    Arthritis    lumbar spondylosis   Colitis    Diabetes mellitus    diet controlled   Fever blister    GERD (gastroesophageal reflux disease)    Neuromuscular disorder (HCC)    nerve pain on L leg from back    Pancreatitis    1980's, pt. reports that she was hospitalized for 3 weeks, [facility]   PONV (postoperative nausea and vomiting)    on previous back surgery, didn't last long   Stroke (HCC)    2012- seen in ER for supposed , everything checked out OK     Past Surgical History:  Procedure Laterality Date   ABDOMINAL HYSTERECTOMY     ANTERIOR CERVICAL DECOMP/DISCECTOMY FUSION N/A 11/18/2015   Procedure: C5-6 C6-7 Anterior cervical decompression/diskectomy/fusion;  Surgeon: Rockey Peru, MD;  Location: [facility];  Service: Neurosurgery;  Laterality: N/A;  C5-6 C6-7 Anterior cervical decompression/diskectomy/fusion   APPENDECTOMY     BACK SURGERY     CATARACT EXTRACTION  01/10/2017   CHOLECYSTECTOMY     COLONOSCOPY WITH PROPOFOL  N/A 04/02/2014   Procedure: COLONOSCOPY WITH PROPOFOL ;  Surgeon: Gladis MARLA Louder, MD;  Location: [facility];  Service: Endoscopy;  Laterality: N/A;   DILATION AND CURETTAGE OF UTERUS     TONSILLECTOMY      Family History  Problem Relation Age of Onset   Cancer Father        prostate   COPD Mother    Breast cancer Maternal Aunt    Breast cancer Cousin    Social History:   reports that she has never smoked. She has never used smokeless tobacco. She reports current alcohol use. She reports that she does not use drugs.  Allergies:  Allergies  Allergen Reactions   Tape Other (See Comments)    Tender skin   Penicillins Swelling and Rash    Has patient had a PCN reaction causing immediate rash, facial/tongue/throat swelling, SOB or lightheadedness with hypotension: Unknown Has patient had a PCN reaction causing severe rash involving mucus membranes or skin necrosis: No Has patient had a PCN reaction that required hospitalization No Has patient had a PCN reaction occurring within the last 10 years: No If all of the above answers are NO, then may proceed with Cephalosporin use.    Current meds: buPROPion  HCL XL 300 mg 24 hr tablet, extended release fenofibrate  160 mg tablet FLUoxetine  40 mg capsule gabapentin  300 mg capsule HYDROcodone  10 mg-acetaminophen  325 mg tablet omeprazole 20 mg capsule,delayed release promethazine  12.5 mg tablet Xanax   Review of Systems  Constitutional: Negative.   HENT: Negative.    Eyes: Negative.   Respiratory: Negative.    Cardiovascular: Negative.   Gastrointestinal: Negative.   Endocrine: Negative.   Genitourinary: Negative.   Musculoskeletal:  Positive for back pain and gait problem.  Neurological:  Positive for weakness and numbness.  Psychiatric/Behavioral: Negative.  There were no vitals taken for this visit. Physical Exam Constitutional:      Appearance: Normal appearance.  HENT:     Head: Normocephalic and atraumatic.     Right Ear: External ear normal.     Left Ear: External ear normal.     Nose: Nose normal.     Mouth/Throat:     Pharynx: Oropharynx is clear.  Eyes:     Conjunctiva/sclera: Conjunctivae normal.  Cardiovascular:     Rate and Rhythm: Normal rate.     Pulses: Normal pulses.     Heart sounds: Normal heart sounds.  Pulmonary:     Effort: Pulmonary effort is normal.     Breath  sounds: Normal breath sounds.  Abdominal:     General: Bowel sounds are normal.  Musculoskeletal:     Cervical back: Normal range of motion.     Comments: Straight leg raising on the right is buttock thigh and calf pain. The right proximal gluteus  Skin:    General: Skin is warm and dry.  Neurological:     Mental Status: She is alert.    X-rays of the knee right demonstrates medial joint space narrowing  X-rays of the pelvis demonstrates no fracture or severe osteoarthritis   Flexion-extension radiographs demonstrate a slight spondylolisthesis at L3-4 and instrumented fusion at L4-5 with no hardware failure. End-stage disc degeneration L5-S1.  MRI demonstrates severe central spinal stenosis at L3-4. With facet effusions and facet arthropathy.  CT myelogram demonstrates moderately severe stenosis at L3-4. Though she reports she was on a tilt table did not fully weight-bear and extend her spine.  Assessment/Plan Impression:  1. Neurogenic claudication secondary to spinal stenosis L3-4 at the adjacent segment of the fusion at L4-5 that has been refractory to injections including selective nerve root blocks at L4 and medial branch blocks of L4 and L5. MRI indicating severe central canal stenosis she has right greater than left symptoms. Weakness in the right lower extremity temperature relief from an epidural 2. Local myofascial trigger point right proximal gluteus  Plan:  Proceeded with local myofascial trigger point right proximal gluteus  We discussed living with her symptoms versus surgery. The MRI definitely shows severe lateral recess stenosis at L3-4 bilaterally. Less so on the CT myelogram she reports she was on the tilt table and there is definitely some ligamentum flavum hypertrophy. She does have L3-L4 nerve root symptomatology and temporary relief from an epidural. She reports she does not able to live with her symptoms.   I had a discussion with the patient concerning  their pathology, relevant anatomy and treatment options.  I had an extensive discussion with the patient concerning the pathology relevant anatomy and treatment options. At this point exhausting conservative treatment and in the presence of a neurologic deficit we discussed microlumbar decompression. I discussed the risks and benefits including bleeding, infection, DVT, PE, anesthetic complications, worsening in their symptoms, improvement in their symptoms, C SF leakage, epidural fibrosis, need for future surgeries such as revision discectomy and lumbar fusion. I also indicated that this is an operation to basically decompress the nerve roots to allow recovery as opposed to fixing a herniated disc if it is encountered and that the incidence of recurrent chest disc herniation can approach 15%. Also that nerve root recovery is variable and may not recover completely. Any ligament or bone that is contributing to compressing the nerves will be removed as well.  I discussed the operative course including overnight in the hospital. Immediate  ambulation. Follow-up in 2 weeks for suture removal. 6 weeks until healing of the herniation and surgical incision followed by 6 weeks of reconditioning and strengthening of the core musculature. Also discussed the need to employ the concepts of disc pressure management and core motion following the surgery to minimize the risk of recurrent disc herniation. We will obtain preoperative clearance i if necessary and proceed accordingly.   Patient was provided a lumbar brace previously. This is indicated to reduce pain by restricting mobility of the spine and to facilitate healing of any injured tissues. In addition it is indicated to support weak paraspinous musculature or a deformity of the spine.   Patient is to call or present to the emergency room if there is any numbness or weakness to suggest worsening of their condition. In addition although rare if there is loss of  bowel or bladder function such as incontinence or inability to void that this may represent a cauda equina syndrome. If noted the patient is to present immediately to the emergency room for evaluation and treatment otherwise permanent bowel or bladder dysfunction can occur as a result.  No history of DVT or MRSA.SABRA Norco or oxycodone  postoperatively. Preoperative clearance.  Plan central laminectomy L3-4  Darice CHRISTELLA Randy, PA-C for Dr Duwayne 03/02/2024, 8:57 AM

## 2024-03-08 NOTE — Progress Notes (Signed)
 Surgical Instructions   Your procedure is scheduled on Thursday, August 14th, 2025. Report to [facility] Main Entrance A at 5:30 A.M., then check in with the Admitting office. Any questions or running late day of surgery: call [phone]  Questions prior to your surgery date: call [phone], Monday-Friday, 8am-4pm. If you experience any cold or flu symptoms such as cough, fever, chills, shortness of breath, etc. between now and your scheduled surgery, please notify us  at the above number.     Remember:  Do not eat after midnight the night before your surgery  You may drink clear liquids until 4:30 the morning of your surgery.   Clear liquids allowed are: Water, Non-Citrus Juices (without pulp), Carbonated Beverages, Clear Tea (no milk, honey, etc.), Black Coffee Only (NO MILK, CREAM OR POWDERED CREAMER of any kind), and Gatorade.   Patient Instructions  The night before surgery:  No food after midnight. ONLY clear liquids after midnight   The day of surgery (if you have diabetes): Drink ONE (1) 12 oz G2 given to you in your pre admission testing appointment by 4:30 the morning of surgery. Drink in one sitting. Do not sip.  This drink was given to you during your hospital  pre-op appointment visit.  Nothing else to drink after completing the  12 oz bottle of G2.         If you have questions, please contact your surgeon's office.     Take these medicines the morning of surgery with A SIP OF WATER: Bupropion  (Wellbutrin ) Fluoxetine  (Prozac ) Gabapentin  (Neurontin ) Omeprazole (Prilosec)   May take these medicines IF NEEDED: Fluticasone (Flonase) Nasal Spray Hydrocodone -acetaminophen  (Norco) Lorazepam  (Ativan ) Valacyclovir  (Valtrex )   One week prior to surgery, STOP taking any Aspirin (unless otherwise instructed by your surgeon) Aleve, Naproxen, Ibuprofen, Motrin, Advil, Goody's, BC's, all herbal medications, fish oil, and non-prescription vitamins.  This includes  your Diclofenac Sodium (Voltaren Gel).     HOW TO MANAGE YOUR DIABETES BEFORE AND AFTER SURGERY  Why is it important to control my blood sugar before and after surgery? Improving blood sugar levels before and after surgery helps healing and can limit problems. A way of improving blood sugar control is eating a healthy diet by:  Eating less sugar and carbohydrates  Increasing activity/exercise  Talking with your doctor about reaching your blood sugar goals High blood sugars (greater than 180 mg/dL) can raise your risk of infections and slow your recovery, so you will need to focus on controlling your diabetes during the weeks before surgery. Make sure that the doctor who takes care of your diabetes knows about your planned surgery including the date and location.  How do I manage my blood sugar before surgery? Check your blood sugar at least 4 times a day, starting 2 days before surgery, to make sure that the level is not too high or low.  Check your blood sugar the morning of your surgery when you wake up and every 2 hours until you get to the Short Stay unit.  If your blood sugar is less than 70 mg/dL, you will need to treat for low blood sugar: Do not take insulin . Treat a low blood sugar (less than 70 mg/dL) with  cup of clear juice (cranberry or apple), 4 glucose tablets, OR glucose gel. Recheck blood sugar in 15 minutes after treatment (to make sure it is greater than 70 mg/dL). If your blood sugar is not greater than 70 mg/dL on recheck, call [phone] for further instructions. Report  your blood sugar to the short stay nurse when you get to Short Stay.  If you are admitted to the hospital after surgery: Your blood sugar will be checked by the staff and you will probably be given insulin  after surgery (instead of oral diabetes medicines) to make sure you have good blood sugar levels. The goal for blood sugar control after surgery is 80-180 mg/dL.                      Do NOT  Smoke (Tobacco/Vaping) for 24 hours prior to your procedure.  If you use a CPAP at night, you may bring your mask/headgear for your overnight stay.   You will be asked to remove any contacts, glasses, piercing's, hearing aid's, dentures/partials prior to surgery. Please bring cases for these items if needed.    Patients discharged the day of surgery will not be allowed to drive home, and someone needs to stay with them for 24 hours.  SURGICAL WAITING ROOM VISITATION Patients may have no more than 2 support people in the waiting area - these visitors may rotate.   Pre-op nurse will coordinate an appropriate time for 1 ADULT support person, who may not rotate, to accompany patient in pre-op.  Children under the age of 8 must have an adult with them who is not the patient and must remain in the main waiting area with an adult.  If the patient needs to stay at the hospital during part of their recovery, the visitor guidelines for inpatient rooms apply.  Please refer to the [facility] website for the visitor guidelines for any additional information.   If you received a COVID test during your pre-op visit  it is requested that you wear a mask when out in public, stay away from anyone that may not be feeling well and notify your surgeon if you develop symptoms. If you have been in contact with anyone that has tested positive in the last 10 days please notify you surgeon.      Pre-operative 5 CHG Bathing Instructions   You can play a key role in reducing the risk of infection after surgery. Your skin needs to be as free of germs as possible. You can reduce the number of germs on your skin by washing with CHG (chlorhexidine gluconate) soap before surgery. CHG is an antiseptic soap that kills germs and continues to kill germs even after washing.   DO NOT use if you have an allergy to chlorhexidine/CHG or antibacterial soaps. If your skin becomes reddened or irritated, stop using the CHG and  notify one of our RNs at [phone].   Please shower with the CHG soap starting 4 days before surgery using the following schedule:     Please keep in mind the following:  DO NOT shave, including legs and underarms, starting the day of your first shower.   You may shave your face at any point before/day of surgery.  Place clean sheets on your bed the day you start using CHG soap. Use a clean washcloth (not used since being washed) for each shower. DO NOT sleep with pets once you start using the CHG.   CHG Shower Instructions:  Wash your face and private area with normal soap. If you choose to wash your hair, wash first with your normal shampoo.  After you use shampoo/soap, rinse your hair and body thoroughly to remove shampoo/soap residue.  Turn the water OFF and apply about 3 tablespoons (45 ml)  of CHG soap to a CLEAN washcloth.  Apply CHG soap ONLY FROM YOUR NECK DOWN TO YOUR TOES (washing for 3-5 minutes)  DO NOT use CHG soap on face, private areas, open wounds, or sores.  Pay special attention to the area where your surgery is being performed.  If you are having back surgery, having someone wash your back for you may be helpful. Wait 2 minutes after CHG soap is applied, then you may rinse off the CHG soap.  Pat dry with a clean towel  Put on clean clothes/pajamas   If you choose to wear lotion, please use ONLY the CHG-compatible lotions that are listed below.  Additional instructions for the day of surgery: DO NOT APPLY any lotions, deodorants, cologne, or perfumes.   Do not bring valuables to the hospital. [facility] is not responsible for any belongings/valuables. Do not wear nail polish, gel polish, artificial nails, or any other type of covering on natural nails (fingers and toes) Do not wear jewelry or makeup Put on clean/comfortable clothes.  Please brush your teeth.  Ask your nurse before applying any prescription medications to the skin.     CHG Compatible  Lotions   Aveeno Moisturizing lotion  Cetaphil Moisturizing Cream  Cetaphil Moisturizing Lotion  Clairol Herbal Essence Moisturizing Lotion, Dry Skin  Clairol Herbal Essence Moisturizing Lotion, Extra Dry Skin  Clairol Herbal Essence Moisturizing Lotion, Normal Skin  Curel Age Defying Therapeutic Moisturizing Lotion with Alpha Hydroxy  Curel Extreme Care Body Lotion  Curel Soothing Hands Moisturizing Hand Lotion  Curel Therapeutic Moisturizing Cream, Fragrance-Free  Curel Therapeutic Moisturizing Lotion, Fragrance-Free  Curel Therapeutic Moisturizing Lotion, Original Formula  Eucerin Daily Replenishing Lotion  Eucerin Dry Skin Therapy Plus Alpha Hydroxy Crme  Eucerin Dry Skin Therapy Plus Alpha Hydroxy Lotion  Eucerin Original Crme  Eucerin Original Lotion  Eucerin Plus Crme Eucerin Plus Lotion  Eucerin TriLipid Replenishing Lotion  Keri Anti-Bacterial Hand Lotion  Keri Deep Conditioning Original Lotion Dry Skin Formula Softly Scented  Keri Deep Conditioning Original Lotion, Fragrance Free Sensitive Skin Formula  Keri Lotion Fast Absorbing Fragrance Free Sensitive Skin Formula  Keri Lotion Fast Absorbing Softly Scented Dry Skin Formula  Keri Original Lotion  Keri Skin Renewal Lotion Keri Silky Smooth Lotion  Keri Silky Smooth Sensitive Skin Lotion  Nivea Body Creamy Conditioning Oil  Nivea Body Extra Enriched Lotion  Nivea Body Original Lotion  Nivea Body Sheer Moisturizing Lotion Nivea Crme  Nivea Skin Firming Lotion  NutraDerm 30 Skin Lotion  NutraDerm Skin Lotion  NutraDerm Therapeutic Skin Cream  NutraDerm Therapeutic Skin Lotion  ProShield Protective Hand Cream  Provon moisturizing lotion  Please read over the following fact sheets that you were given.

## 2024-03-09 ENCOUNTER — Encounter (HOSPITAL_COMMUNITY)
Admission: RE | Admit: 2024-03-09 | Discharge: 2024-03-09 | Disposition: A | Discharge status: Home / Self Care | Source: Ambulatory Visit | Attending: Specialist | Admitting: Specialist

## 2024-03-09 ENCOUNTER — Other Ambulatory Visit: Payer: Self-pay

## 2024-03-09 ENCOUNTER — Encounter (HOSPITAL_COMMUNITY): Payer: Self-pay

## 2024-03-09 ENCOUNTER — Ambulatory Visit (HOSPITAL_COMMUNITY)
Admission: RE | Admit: 2024-03-09 | Discharge: 2024-03-09 | Disposition: A | Discharge status: Home / Self Care | Source: Ambulatory Visit | Attending: Orthopedic Surgery | Admitting: Orthopedic Surgery

## 2024-03-09 VITALS — BP 158/83 | HR 81 | Temp 98.0°F | Resp 17 | Ht 64.5 in | Wt 167.4 lb

## 2024-03-09 DIAGNOSIS — Z01818 Encounter for other preprocedural examination: Secondary | ICD-10-CM | POA: Insufficient documentation

## 2024-03-09 DIAGNOSIS — M48062 Spinal stenosis, lumbar region with neurogenic claudication: Secondary | ICD-10-CM

## 2024-03-09 DIAGNOSIS — M4807 Spinal stenosis, lumbosacral region: Secondary | ICD-10-CM | POA: Diagnosis not present

## 2024-03-09 DIAGNOSIS — M4316 Spondylolisthesis, lumbar region: Secondary | ICD-10-CM | POA: Diagnosis not present

## 2024-03-09 DIAGNOSIS — M51379 Other intervertebral disc degeneration, lumbosacral region without mention of lumbar back pain or lower extremity pain: Secondary | ICD-10-CM | POA: Diagnosis not present

## 2024-03-09 LAB — CBC
HCT: 45.8 % (ref 36.0–46.0)
Hemoglobin: 14.9 g/dL (ref 12.0–15.0)
MCH: 31.5 pg (ref 26.0–34.0)
MCHC: 32.5 g/dL (ref 30.0–36.0)
MCV: 96.8 fL (ref 80.0–100.0)
Platelets: 250 K/uL (ref 150–400)
RBC: 4.73 MIL/uL (ref 3.87–5.11)
RDW: 12 % (ref 11.5–15.5)
WBC: 7.1 K/uL (ref 4.0–10.5)
nRBC: 0 % (ref 0.0–0.2)

## 2024-03-09 LAB — BASIC METABOLIC PANEL WITH GFR
Anion gap: 10 (ref 5–15)
BUN: 14 mg/dL (ref 8–23)
CO2: 25 mmol/L (ref 22–32)
Calcium: 9.1 mg/dL (ref 8.9–10.3)
Chloride: 104 mmol/L (ref 98–111)
Creatinine, Ser: 0.71 mg/dL (ref 0.44–1.00)
GFR, Estimated: >60 mL/min (ref >60)
Glucose, Bld: 119 mg/dL — ABNORMAL HIGH (ref 70–99)
Potassium: 4.6 mmol/L (ref 3.5–5.1)
Sodium: 139 mmol/L (ref 135–145)

## 2024-03-09 LAB — SURGICAL PCR SCREEN
MRSA, PCR: NEGATIVE
Staphylococcus aureus: POSITIVE — AB

## 2024-03-09 LAB — GLUCOSE, CAPILLARY: Glucose-Capillary: 130 mg/dL — ABNORMAL HIGH (ref 70–99)

## 2024-03-09 NOTE — Progress Notes (Signed)
 PCP - RONALD POLITE  Cardiologist -   PPM/ICD - denies Device Orders -  Rep Notified -   Chest x-ray - denies EKG - requested Stress Test -  ECHO - requested Cardiac Cath -   Sleep Study - denies CPAP - n/a  Dm-denies  Blood Thinner Instructions:denies Aspirin Instructions:n/a  ERAS Protcol - clear liquids until 4:30 am PRE-SURGERY G2-   COVID TEST- n/a   Anesthesia review: yes EKG review   Patient denies shortness of breath, fever, cough and chest pain at PAT appointment   All instructions explained to the patient, with a verbal understanding of the material. Patient agrees to go over the instructions while at home for a better understanding. Patient also instructed to self quarantine after being tested for COVID-19. The opportunity to ask questions was provided.

## 2024-03-12 NOTE — Progress Notes (Addendum)
 Anesthesia Chart Review:  Case: [id number] Date/Time: 03/15/24 0715   Procedure: DECOMPRESSIVE LUMBAR LAMINECTOMY LEVEL 1 - Central Laminectomy L3-4   Anesthesia type: General   Diagnosis: Spinal stenosis of lumbar region, unspecified whether neurogenic claudication present [M48.061]   Pre-op diagnosis: Stenosis L3-4   [facility] ROOM [facility] / [facility] OR   Surgeons: Duwayne Purchase, MD       DISCUSSION: Patient is an 88 year old female scheduled for the above procedure.  History includes never smoker, post-operative N/V, DM2, GERD, pancreatitis (1980's), anxiety, spinal surgery (L4-5 PLIF 04/19/12; C5-7 ACDF 11/18/15).  She had preoperative surgical clearance with Dr. Rexanne on 01/19/24. Visit included labs and EKG. He also ordered an echo for LE edema and EKG showing poor R wave progression. He also started her on Lasix x 3 days than as needed for LE edema. LE Venous duplex also ordered after D-dimer came back slightly elevated at 0.73. He classified METS activity > 4. A1c 7.0% then.   LE Venous Duplex report requested from [facility], but TTE there on 01/25/24 showed LVEF 59%, grade 1 DD, AV sclerosis with trace AR, mild MR, mild TR, RVSP 38 mmHg, trace PR. Dr. Rexanne did sign a note of surgical clearance (scanned under Media tab, Date 02/09/24).  Anesthesia team to evaluate on the day of surgery.   ADDENDUM 03/13/24 12:42 PM: Received copy of 01/23/24 LE venous US  showing no evidence of BLE DVT.   VS: BP (!) 158/83   Pulse 81   Temp 36.7 C   Resp 17   Ht 5' 4.5 (1.638 m)   Wt 75.9 kg   SpO2 97%   BMI 28.29 kg/m   PROVIDERS: Rexanne Ingle, MD is PCP    LABS: Labs reviewed: Acceptable for surgery. (all labs ordered are listed, but only abnormal results are displayed)  Labs Reviewed  SURGICAL PCR SCREEN - Abnormal; Notable for the following components:      Result Value   Staphylococcus aureus POSITIVE (*)    All other components within normal limits  GLUCOSE, CAPILLARY -  Abnormal; Notable for the following components:   Glucose-Capillary 130 (*)    All other components within normal limits  BASIC METABOLIC PANEL WITH GFR - Abnormal; Notable for the following components:   Glucose, Bld 119 (*)    All other components within normal limits  CBC  LFTs normal and A1c 7.0% on 01/19/24 at [facility] (see CE or Media tab Correspondence).     IMAGES: Xray L-spine 03/09/24: IMPRESSION: 1. Posterior fusion at L4-L5. 2. Grade 1 anterolisthesis of L3 on L4 with moderate facet hypertrophy at this level. 3. L5-S1 degenerative disc disease.   CT L-spine 12/13/23: IMPRESSION: 1. Moderate spinal stenosis at L3-4 and mild spinal stenosis at L2-3. 2. Solid L4-5 fusion without stenosis. 3.  Aortic Atherosclerosis (ICD10-I70.0).   EKG: 01/19/24 [facility]; scanned under Media tab Correspondence): Sinus rhythm.  First-degree AV block, PR 238 ms.  Anterior septal infarct, age undetermined.   CV: LE Venous US  ([facility]; scanned under Media tab Correspondence):  SUMMARY: BILATERAL: No evidence of deep vein thrombosis seen in the lower extremities, bilaterally. No evidence of superficial venous thrombosis in the lower extremities, bilaterally. No evidence of popliteal cyst, bilaterally. RIGHT: Mild superficial edema noted at the medial ankle. LEFT: Mild superficial edema noted at the medial ankle.   Echo 01/25/24 [facility]; scanned under Media tab Correspondence): Conclusions: Left ventricle cavity is normal in size. Mild concentric hypertrophy of the left ventricle.  Normal wall motion.  Doppler evidence of grade 1 (impaired) diastolic function.  Calculated EF 59%. Trileaflet aortic valve with trace regurgitation.  Sclerosis of the aortic valve. Mild (grade 1) mitral regurgitation.  Mild calcification of the mitral valve annulus. Structurally normal tricuspid valve with mild regurgitation.  Mild pulmonary hypertension.  RVSP 38  mmHg. Structurally normal pulmonic valve with trace regurgitation.   Past Medical History:  Diagnosis Date   Anxiety    Arthritis    lumbar spondylosis   Colitis    Diabetes mellitus    diet controlled   Fever blister    GERD (gastroesophageal reflux disease)    Neuromuscular disorder (HCC)    nerve pain on L leg from back    Pancreatitis    1980's, pt. reports that she was hospitalized for 3 weeks, [facility]   PONV (postoperative nausea and vomiting)    on previous back surgery, didn't last long    Past Surgical History:  Procedure Laterality Date   ABDOMINAL HYSTERECTOMY     ANTERIOR CERVICAL DECOMP/DISCECTOMY FUSION N/A 11/18/2015   Procedure: C5-6 C6-7 Anterior cervical decompression/diskectomy/fusion;  Surgeon: Rockey Peru, MD;  Location: [facility] NEURO ORS;  Service: Neurosurgery;  Laterality: N/A;  C5-6 C6-7 Anterior cervical decompression/diskectomy/fusion   APPENDECTOMY     BACK SURGERY     CATARACT EXTRACTION  01/10/2017   CHOLECYSTECTOMY     COLONOSCOPY WITH PROPOFOL  N/A 04/02/2014   Procedure: COLONOSCOPY WITH PROPOFOL ;  Surgeon: Gladis MARLA Louder, MD;  Location: [facility] ENDOSCOPY;  Service: Endoscopy;  Laterality: N/A;   DILATION AND CURETTAGE OF UTERUS     TONSILLECTOMY      MEDICATIONS:  buPROPion  (WELLBUTRIN  XL) 300 MG 24 hr tablet   diclofenac Sodium (VOLTAREN) 1 % GEL   fenofibrate  160 MG tablet   FLUoxetine  (PROZAC ) 40 MG capsule   fluticasone  (FLONASE ) 50 MCG/ACT nasal spray   gabapentin  (NEURONTIN ) 300 MG capsule   HYDROcodone -acetaminophen  (NORCO) 10-325 MG tablet   LORazepam  (ATIVAN ) 1 MG tablet   omeprazole (PRILOSEC) 20 MG capsule   tiZANidine  (ZANAFLEX ) 4 MG tablet   valACYclovir  (VALTREX ) 1000 MG tablet   No current facility-administered medications for this encounter.    Isaiah Ruder, PA-C Surgical Short Stay/Anesthesiology [facility] Phone [phone] [facility] Phone [phone] 03/12/2024 5:35 PM

## 2024-03-12 NOTE — Anesthesia Preprocedure Evaluation (Addendum)
 Anesthesia Evaluation  Patient identified by MRN, date of birth, ID band Patient awake    Reviewed: Allergy & Precautions, NPO status , Patient's Chart, lab work & pertinent test results  History of Anesthesia Complications (+) PONV and history of anesthetic complications  Airway Mallampati: III  TM Distance: >3 FB Neck ROM: Full    Dental  (+) Dental Advisory Given, Teeth Intact, Caps   Pulmonary    Pulmonary exam normal breath sounds clear to auscultation       Cardiovascular pulmonary hypertension+ Valvular Problems/Murmurs MR  Rhythm:Regular Rate:Normal + Systolic murmurs    Neuro/Psych  PSYCHIATRIC DISORDERS Anxiety Depression     Neuromuscular disease CVA    GI/Hepatic Neg liver ROS,GERD  Medicated,,  Endo/Other  diabetes    Renal/GU negative Renal ROS     Musculoskeletal  (+) Arthritis ,    Abdominal   Peds  Hematology negative hematology ROS (+)   Anesthesia Other Findings   Reproductive/Obstetrics                              Lab Results  Component Value Date   WBC 7.1 03/09/2024   HGB 14.9 03/09/2024   HCT 45.8 03/09/2024   MCV 96.8 03/09/2024   PLT 250 03/09/2024   Lab Results  Component Value Date   CREATININE 0.71 03/09/2024   BUN 14 03/09/2024   NA 139 03/09/2024   K 4.6 03/09/2024   CL 104 03/09/2024   CO2 25 03/09/2024   No results found for: INR, PROTIME  09/2015 EKG: normal sinus rhythm, PAC's noted.  Anesthesia Physical Anesthesia Plan  ASA: 3  Anesthesia Plan: General   Post-op Pain Management: Tylenol  PO (pre-op)*   Induction: Intravenous  PONV Risk Score and Plan: 4 or greater and Treatment may vary due to age or medical condition, Ondansetron  and Dexamethasone   Airway Management Planned: Oral ETT and Video Laryngoscope Planned  Additional Equipment:   Intra-op Plan:   Post-operative Plan: Extubation in OR  Informed Consent: I have  reviewed the patients History and Physical, chart, labs and discussed the procedure including the risks, benefits and alternatives for the proposed anesthesia with the patient or authorized representative who has indicated his/her understanding and acceptance.     Dental advisory given  Plan Discussed with: CRNA  Anesthesia Plan Comments: (PAT note written by Allison Zelenak, PA-C.  )         Anesthesia Quick Evaluation

## 2024-03-15 ENCOUNTER — Other Ambulatory Visit: Payer: Self-pay

## 2024-03-15 ENCOUNTER — Ambulatory Visit (HOSPITAL_BASED_OUTPATIENT_CLINIC_OR_DEPARTMENT_OTHER): Payer: Self-pay | Admitting: Anesthesiology

## 2024-03-15 ENCOUNTER — Encounter (HOSPITAL_COMMUNITY)
Admission: RE | Disposition: A | Discharge status: Home / Self Care | Payer: Self-pay | Source: Home / Self Care | Attending: Specialist

## 2024-03-15 ENCOUNTER — Encounter (HOSPITAL_COMMUNITY): Payer: Self-pay | Admitting: Specialist

## 2024-03-15 ENCOUNTER — Ambulatory Visit (HOSPITAL_COMMUNITY): Payer: Self-pay | Admitting: Vascular Surgery

## 2024-03-15 ENCOUNTER — Ambulatory Visit (HOSPITAL_COMMUNITY)
Admission: RE | Admit: 2024-03-15 | Discharge: 2024-03-16 | Disposition: A | Discharge status: Home / Self Care | Attending: Specialist | Admitting: Specialist

## 2024-03-15 ENCOUNTER — Ambulatory Visit (HOSPITAL_COMMUNITY)

## 2024-03-15 DIAGNOSIS — F32A Depression, unspecified: Secondary | ICD-10-CM | POA: Insufficient documentation

## 2024-03-15 DIAGNOSIS — M4316 Spondylolisthesis, lumbar region: Secondary | ICD-10-CM | POA: Diagnosis not present

## 2024-03-15 DIAGNOSIS — Z0189 Encounter for other specified special examinations: Secondary | ICD-10-CM | POA: Diagnosis not present

## 2024-03-15 DIAGNOSIS — M7138 Other bursal cyst, other site: Secondary | ICD-10-CM | POA: Insufficient documentation

## 2024-03-15 DIAGNOSIS — Z8673 Personal history of transient ischemic attack (TIA), and cerebral infarction without residual deficits: Secondary | ICD-10-CM | POA: Diagnosis not present

## 2024-03-15 DIAGNOSIS — I679 Cerebrovascular disease, unspecified: Secondary | ICD-10-CM

## 2024-03-15 DIAGNOSIS — Z79899 Other long term (current) drug therapy: Secondary | ICD-10-CM | POA: Insufficient documentation

## 2024-03-15 DIAGNOSIS — I272 Pulmonary hypertension, unspecified: Secondary | ICD-10-CM | POA: Insufficient documentation

## 2024-03-15 DIAGNOSIS — E119 Type 2 diabetes mellitus without complications: Secondary | ICD-10-CM | POA: Diagnosis not present

## 2024-03-15 DIAGNOSIS — Z981 Arthrodesis status: Secondary | ICD-10-CM | POA: Diagnosis not present

## 2024-03-15 DIAGNOSIS — F418 Other specified anxiety disorders: Secondary | ICD-10-CM | POA: Diagnosis not present

## 2024-03-15 DIAGNOSIS — M48061 Spinal stenosis, lumbar region without neurogenic claudication: Secondary | ICD-10-CM

## 2024-03-15 DIAGNOSIS — M1990 Unspecified osteoarthritis, unspecified site: Secondary | ICD-10-CM | POA: Insufficient documentation

## 2024-03-15 DIAGNOSIS — R2689 Other abnormalities of gait and mobility: Secondary | ICD-10-CM | POA: Diagnosis not present

## 2024-03-15 DIAGNOSIS — K219 Gastro-esophageal reflux disease without esophagitis: Secondary | ICD-10-CM | POA: Insufficient documentation

## 2024-03-15 DIAGNOSIS — R531 Weakness: Secondary | ICD-10-CM | POA: Diagnosis not present

## 2024-03-15 DIAGNOSIS — F419 Anxiety disorder, unspecified: Secondary | ICD-10-CM | POA: Diagnosis not present

## 2024-03-15 DIAGNOSIS — M5416 Radiculopathy, lumbar region: Secondary | ICD-10-CM | POA: Diagnosis not present

## 2024-03-15 DIAGNOSIS — M48062 Spinal stenosis, lumbar region with neurogenic claudication: Secondary | ICD-10-CM | POA: Insufficient documentation

## 2024-03-15 HISTORY — PX: DECOMPRESSIVE LUMBAR LAMINECTOMY LEVEL 1: SHX5791

## 2024-03-15 LAB — GLUCOSE, CAPILLARY
Glucose-Capillary: 142 mg/dL — ABNORMAL HIGH (ref 70–99)
Glucose-Capillary: 131 mg/dL — ABNORMAL HIGH (ref 70–99)

## 2024-03-15 SURGERY — DECOMPRESSIVE LUMBAR LAMINECTOMY LEVEL 1
Anesthesia: General

## 2024-03-15 MED ORDER — PHENYLEPHRINE 80 MCG/ML (10ML) SYRINGE FOR IV PUSH (FOR BLOOD PRESSURE SUPPORT)
PREFILLED_SYRINGE | INTRAVENOUS | Status: AC
  Filled 2024-03-15: qty 20

## 2024-03-15 MED ORDER — PHENOL 1.4 % MT LIQD: 1.0000 | OROMUCOSAL | Status: DC | PRN

## 2024-03-15 MED ORDER — BISACODYL 5 MG PO TBEC: 5.0000 mg | DELAYED_RELEASE_TABLET | Freq: Every day | ORAL | Status: DC | PRN

## 2024-03-15 MED ORDER — DEXAMETHASONE SODIUM PHOSPHATE 10 MG/ML IJ SOLN
INTRAMUSCULAR | Status: DC | PRN
  Administered 2024-03-15: 10 mg via INTRAVENOUS

## 2024-03-15 MED ORDER — LIDOCAINE 2% (20 MG/ML) 5 ML SYRINGE
INTRAMUSCULAR | Status: AC
  Filled 2024-03-15: qty 5

## 2024-03-15 MED ORDER — HYDROMORPHONE HCL 1 MG/ML IJ SOLN
0.2500 mg | INTRAMUSCULAR | Status: DC | PRN
  Administered 2024-03-15: 0.5 mg via INTRAVENOUS
  Administered 2024-03-15: 0.25 mg via INTRAVENOUS

## 2024-03-15 MED ORDER — DOCUSATE SODIUM 100 MG PO CAPS
100.0000 mg | ORAL_CAPSULE | Freq: Two times a day (BID) | ORAL | Status: DC
  Administered 2024-03-15 – 2024-03-16 (×3): 100 mg via ORAL
  Filled 2024-03-15 (×3): qty 1

## 2024-03-15 MED ORDER — RISAQUAD PO CAPS
1.0000 | ORAL_CAPSULE | Freq: Every day | ORAL | Status: DC
  Administered 2024-03-15 – 2024-03-16 (×2): 1 via ORAL
  Filled 2024-03-15 (×2): qty 1

## 2024-03-15 MED ORDER — ONDANSETRON HCL 4 MG/2ML IJ SOLN: 4.0000 mg | Freq: Four times a day (QID) | INTRAMUSCULAR | Status: DC | PRN

## 2024-03-15 MED ORDER — FLUTICASONE PROPIONATE 50 MCG/ACT NA SUSP: 2.0000 | Freq: Every day | NASAL | Status: DC | PRN

## 2024-03-15 MED ORDER — DEXAMETHASONE SODIUM PHOSPHATE 10 MG/ML IJ SOLN
INTRAMUSCULAR | Status: AC
  Filled 2024-03-15: qty 1

## 2024-03-15 MED ORDER — CEFAZOLIN SODIUM-DEXTROSE 2-4 GM/100ML-% IV SOLN
2.0000 g | INTRAVENOUS | Status: AC
  Administered 2024-03-15: 2 g via INTRAVENOUS

## 2024-03-15 MED ORDER — FENOFIBRATE 160 MG PO TABS
160.0000 mg | ORAL_TABLET | Freq: Every day | ORAL | Status: DC
  Administered 2024-03-16: 160 mg via ORAL
  Filled 2024-03-15: qty 1

## 2024-03-15 MED ORDER — DEXMEDETOMIDINE HCL IN NACL 80 MCG/20ML IV SOLN
INTRAVENOUS | Status: DC | PRN
  Administered 2024-03-15: 4 ug via INTRAVENOUS
  Administered 2024-03-15: 8 ug via INTRAVENOUS

## 2024-03-15 MED ORDER — THROMBIN 20000 UNITS EX SOLR
CUTANEOUS | Status: DC | PRN
  Administered 2024-03-15: 20 mL via TOPICAL

## 2024-03-15 MED ORDER — ACETAMINOPHEN 650 MG RE SUPP: 650.0000 mg | RECTAL | Status: DC | PRN

## 2024-03-15 MED ORDER — CEFAZOLIN SODIUM-DEXTROSE 2-4 GM/100ML-% IV SOLN
2.0000 g | Freq: Three times a day (TID) | INTRAVENOUS | Status: AC
  Administered 2024-03-15 – 2024-03-16 (×3): 2 g via INTRAVENOUS
  Filled 2024-03-15 (×3): qty 100

## 2024-03-15 MED ORDER — POLYETHYLENE GLYCOL 3350 17 G PO PACK: 17.0000 g | PACK | Freq: Every day | ORAL | Status: DC | PRN

## 2024-03-15 MED ORDER — VALACYCLOVIR HCL 500 MG PO TABS: 1000.0000 mg | ORAL_TABLET | Freq: Two times a day (BID) | ORAL | Status: DC | PRN

## 2024-03-15 MED ORDER — OXYCODONE HCL 5 MG PO TABS
5.0000 mg | ORAL_TABLET | ORAL | Status: DC | PRN
  Administered 2024-03-15 (×2): 5 mg via ORAL
  Filled 2024-03-15 (×2): qty 1

## 2024-03-15 MED ORDER — PROPOFOL 500 MG/50ML IV EMUL
INTRAVENOUS | Status: DC | PRN
  Administered 2024-03-15: 100 ug/kg/min via INTRAVENOUS

## 2024-03-15 MED ORDER — BUPROPION HCL ER (XL) 300 MG PO TB24
300.0000 mg | ORAL_TABLET | Freq: Every morning | ORAL | Status: DC
  Administered 2024-03-15 – 2024-03-16 (×2): 300 mg via ORAL
  Filled 2024-03-15 (×2): qty 1

## 2024-03-15 MED ORDER — ROCURONIUM BROMIDE 10 MG/ML (PF) SYRINGE
PREFILLED_SYRINGE | INTRAVENOUS | Status: DC | PRN
  Administered 2024-03-15: 60 mg via INTRAVENOUS
  Administered 2024-03-15 (×2): 20 mg via INTRAVENOUS

## 2024-03-15 MED ORDER — MENTHOL 3 MG MT LOZG: 1.0000 | LOZENGE | OROMUCOSAL | Status: DC | PRN

## 2024-03-15 MED ORDER — CHLORHEXIDINE GLUCONATE 0.12 % MT SOLN
OROMUCOSAL | Status: AC
  Administered 2024-03-15: 15 mL via OROMUCOSAL
  Filled 2024-03-15: qty 15

## 2024-03-15 MED ORDER — TIZANIDINE HCL 4 MG PO TABS
4.0000 mg | ORAL_TABLET | Freq: Four times a day (QID) | ORAL | Status: DC | PRN
  Administered 2024-03-15 – 2024-03-16 (×3): 4 mg via ORAL
  Filled 2024-03-15 (×3): qty 1

## 2024-03-15 MED ORDER — TRANEXAMIC ACID-NACL 1000-0.7 MG/100ML-% IV SOLN
INTRAVENOUS | Status: AC
  Filled 2024-03-15: qty 100

## 2024-03-15 MED ORDER — HYDROMORPHONE HCL 1 MG/ML IJ SOLN
0.5000 mg | INTRAMUSCULAR | Status: DC | PRN
  Administered 2024-03-15: 0.5 mg via INTRAVENOUS
  Filled 2024-03-15: qty 0.5

## 2024-03-15 MED ORDER — PHENYLEPHRINE HCL-NACL 20-0.9 MG/250ML-% IV SOLN
INTRAVENOUS | Status: DC | PRN
  Administered 2024-03-15: 25 ug/min via INTRAVENOUS

## 2024-03-15 MED ORDER — INSULIN ASPART 100 UNIT/ML IJ SOLN: 0.0000 [IU] | INTRAMUSCULAR | Status: DC | PRN

## 2024-03-15 MED ORDER — PROPOFOL 10 MG/ML IV BOLUS
INTRAVENOUS | Status: DC | PRN
  Administered 2024-03-15: 110 mg via INTRAVENOUS

## 2024-03-15 MED ORDER — ORAL CARE MOUTH RINSE: 15.0000 mL | Freq: Once | OROMUCOSAL | Status: AC

## 2024-03-15 MED ORDER — ROCURONIUM BROMIDE 10 MG/ML (PF) SYRINGE
PREFILLED_SYRINGE | INTRAVENOUS | Status: AC
  Filled 2024-03-15: qty 20

## 2024-03-15 MED ORDER — ONDANSETRON HCL 4 MG PO TABS: 4.0000 mg | ORAL_TABLET | Freq: Four times a day (QID) | ORAL | Status: DC | PRN

## 2024-03-15 MED ORDER — ACETAMINOPHEN 10 MG/ML IV SOLN
1000.0000 mg | INTRAVENOUS | Status: AC
  Administered 2024-03-15: 1000 mg via INTRAVENOUS

## 2024-03-15 MED ORDER — THROMBIN 20000 UNITS EX SOLR
CUTANEOUS | Status: AC
Start: 2024-03-15 — End: 2024-03-15
  Filled 2024-03-15: qty 20000

## 2024-03-15 MED ORDER — CHLORHEXIDINE GLUCONATE 0.12 % MT SOLN
15.0000 mL | Freq: Once | OROMUCOSAL | Status: AC
Start: 2024-03-15 — End: 2024-03-15

## 2024-03-15 MED ORDER — FLUOXETINE HCL 20 MG PO CAPS
40.0000 mg | ORAL_CAPSULE | Freq: Every day | ORAL | Status: DC
  Administered 2024-03-15 – 2024-03-16 (×2): 40 mg via ORAL
  Filled 2024-03-15 (×2): qty 2

## 2024-03-15 MED ORDER — PHENYLEPHRINE 80 MCG/ML (10ML) SYRINGE FOR IV PUSH (FOR BLOOD PRESSURE SUPPORT)
PREFILLED_SYRINGE | INTRAVENOUS | Status: DC | PRN
  Administered 2024-03-15 (×3): 80 ug via INTRAVENOUS
  Administered 2024-03-15: 40 ug via INTRAVENOUS

## 2024-03-15 MED ORDER — PANTOPRAZOLE SODIUM 40 MG PO TBEC
40.0000 mg | DELAYED_RELEASE_TABLET | Freq: Every day | ORAL | Status: DC
  Administered 2024-03-15 – 2024-03-16 (×2): 40 mg via ORAL
  Filled 2024-03-15 (×2): qty 1

## 2024-03-15 MED ORDER — BUPIVACAINE-EPINEPHRINE (PF) 0.25% -1:200000 IJ SOLN
INTRAMUSCULAR | Status: AC
  Filled 2024-03-15: qty 30

## 2024-03-15 MED ORDER — LORAZEPAM 0.5 MG PO TABS: 1.0000 mg | ORAL_TABLET | Freq: Every day | ORAL | Status: DC | PRN

## 2024-03-15 MED ORDER — HYDROMORPHONE HCL 1 MG/ML IJ SOLN
INTRAMUSCULAR | Status: AC
  Filled 2024-03-15: qty 1

## 2024-03-15 MED ORDER — ONDANSETRON HCL 4 MG/2ML IJ SOLN
INTRAMUSCULAR | Status: DC | PRN
  Administered 2024-03-15: 4 mg via INTRAVENOUS

## 2024-03-15 MED ORDER — HYDROMORPHONE HCL 1 MG/ML IJ SOLN
INTRAMUSCULAR | Status: AC
  Filled 2024-03-15: qty 0.5

## 2024-03-15 MED ORDER — KCL IN DEXTROSE-NACL 20-5-0.9 MEQ/L-%-% IV SOLN
INTRAVENOUS | Status: AC
  Filled 2024-03-15: qty 1000

## 2024-03-15 MED ORDER — EPHEDRINE SULFATE-NACL 50-0.9 MG/10ML-% IV SOSY
PREFILLED_SYRINGE | INTRAVENOUS | Status: DC | PRN
  Administered 2024-03-15 (×2): 5 mg via INTRAVENOUS

## 2024-03-15 MED ORDER — LIDOCAINE 2% (20 MG/ML) 5 ML SYRINGE
INTRAMUSCULAR | Status: DC | PRN
  Administered 2024-03-15: 70 mg via INTRAVENOUS

## 2024-03-15 MED ORDER — ALUM & MAG HYDROXIDE-SIMETH 200-200-20 MG/5ML PO SUSP: 30.0000 mL | Freq: Four times a day (QID) | ORAL | Status: DC | PRN

## 2024-03-15 MED ORDER — DROPERIDOL 2.5 MG/ML IJ SOLN: 0.6250 mg | Freq: Once | INTRAMUSCULAR | Status: DC | PRN

## 2024-03-15 MED ORDER — FENTANYL CITRATE (PF) 250 MCG/5ML IJ SOLN
INTRAMUSCULAR | Status: DC | PRN
  Administered 2024-03-15: 100 ug via INTRAVENOUS
  Administered 2024-03-15 (×3): 50 ug via INTRAVENOUS

## 2024-03-15 MED ORDER — SUGAMMADEX SODIUM 200 MG/2ML IV SOLN
INTRAVENOUS | Status: DC | PRN
  Administered 2024-03-15: 200 mg via INTRAVENOUS
  Administered 2024-03-15: 100 mg via INTRAVENOUS

## 2024-03-15 MED ORDER — OXYCODONE HCL 5 MG PO TABS
10.0000 mg | ORAL_TABLET | ORAL | Status: DC | PRN
  Administered 2024-03-15 – 2024-03-16 (×3): 10 mg via ORAL
  Filled 2024-03-15 (×3): qty 2

## 2024-03-15 MED ORDER — 0.9 % SODIUM CHLORIDE (POUR BTL) OPTIME
TOPICAL | Status: DC | PRN
  Administered 2024-03-15: 1000 mL

## 2024-03-15 MED ORDER — TRANEXAMIC ACID-NACL 1000-0.7 MG/100ML-% IV SOLN
1000.0000 mg | INTRAVENOUS | Status: AC
  Administered 2024-03-15: 1000 mg via INTRAVENOUS

## 2024-03-15 MED ORDER — ACETAMINOPHEN 325 MG PO TABS: 650.0000 mg | ORAL_TABLET | ORAL | Status: DC | PRN

## 2024-03-15 MED ORDER — BUPIVACAINE-EPINEPHRINE 0.5% -1:200000 IJ SOLN
INTRAMUSCULAR | Status: DC | PRN
  Administered 2024-03-15: 5 mL

## 2024-03-15 MED ORDER — GABAPENTIN 300 MG PO CAPS
300.0000 mg | ORAL_CAPSULE | Freq: Two times a day (BID) | ORAL | Status: DC
  Administered 2024-03-15 – 2024-03-16 (×3): 300 mg via ORAL
  Filled 2024-03-15 (×3): qty 1

## 2024-03-15 MED ORDER — ACETAMINOPHEN 10 MG/ML IV SOLN
INTRAVENOUS | Status: AC
  Filled 2024-03-15: qty 100

## 2024-03-15 MED ORDER — LACTATED RINGERS IV SOLN: INTRAVENOUS | Status: DC

## 2024-03-15 MED ORDER — ONDANSETRON HCL 4 MG/2ML IJ SOLN
INTRAMUSCULAR | Status: AC
  Filled 2024-03-15: qty 2

## 2024-03-15 MED ORDER — CEFAZOLIN SODIUM-DEXTROSE 2-4 GM/100ML-% IV SOLN
INTRAVENOUS | Status: AC
  Filled 2024-03-15: qty 100

## 2024-03-15 MED ORDER — FENTANYL CITRATE (PF) 250 MCG/5ML IJ SOLN
INTRAMUSCULAR | Status: AC
  Filled 2024-03-15: qty 5

## 2024-03-15 SURGICAL SUPPLY — 50 items
BAG COUNTER SPONGE SURGICOUNT (BAG) ×1 IMPLANT
BAG DECANTER FOR FLEXI CONT (MISCELLANEOUS) IMPLANT
BAND RUBBER #18 3X1/16 STRL (MISCELLANEOUS) ×2 IMPLANT
BUR EGG ELITE 5.0 (BURR) IMPLANT
BUR RND DIAMOND ELITE 4.0 (BURR) IMPLANT
CLEANER TIP ELECTROSURG 2X2 (MISCELLANEOUS) ×1 IMPLANT
CNTNR URN SCR LID CUP LEK RST (MISCELLANEOUS) ×1 IMPLANT
DRAPE HALF SHEET 40X57 (DRAPES) IMPLANT
DRAPE LAPAROTOMY 100X72X124 (DRAPES) ×1 IMPLANT
DRAPE MICROSCOPE SLANT 54X150 (MISCELLANEOUS) ×1 IMPLANT
DRAPE SHEET LG 3/4 BI-LAMINATE (DRAPES) ×1 IMPLANT
DRAPE SURG 17X11 SM STRL (DRAPES) ×1 IMPLANT
DRAPE UTILITY XL STRL (DRAPES) ×1 IMPLANT
DRSG AQUACEL AG ADV 3.5X 4 (GAUZE/BANDAGES/DRESSINGS) IMPLANT
DRSG AQUACEL AG ADV 3.5X 6 (GAUZE/BANDAGES/DRESSINGS) IMPLANT
DRSG TELFA 3X8 NADH STRL (GAUZE/BANDAGES/DRESSINGS) IMPLANT
DURAPREP 26ML APPLICATOR (WOUND CARE) ×1 IMPLANT
DURASEAL SPINE SEALANT 3ML (MISCELLANEOUS) IMPLANT
ELECTRODE BLDE 4.0 EZ CLN MEGD (MISCELLANEOUS) IMPLANT
ELECTRODE REM PT RTRN 9FT ADLT (ELECTROSURGICAL) ×1 IMPLANT
GLOVE BIOGEL PI IND STRL 7.5 (GLOVE) ×1 IMPLANT
GLOVE SURG SS PI 7.0 STRL IVOR (GLOVE) ×1 IMPLANT
GLOVE SURG SS PI 8.0 STRL IVOR (GLOVE) ×2 IMPLANT
GOWN STRL REUS W/ TWL LRG LVL3 (GOWN DISPOSABLE) ×1 IMPLANT
GOWN STRL REUS W/ TWL XL LVL3 (GOWN DISPOSABLE) ×1 IMPLANT
IV CATH 14GX2 1/4 (CATHETERS) ×1 IMPLANT
KIT BASIN OR (CUSTOM PROCEDURE TRAY) ×1 IMPLANT
NDL 22X1.5 STRL (OR ONLY) (MISCELLANEOUS) ×1 IMPLANT
NDL SPNL 18GX3.5 QUINCKE PK (NEEDLE) ×2 IMPLANT
NEEDLE 22X1.5 STRL (OR ONLY) (MISCELLANEOUS) ×1 IMPLANT
NEEDLE SPNL 18GX3.5 QUINCKE PK (NEEDLE) ×2 IMPLANT
PACK LAMINECTOMY NEURO (CUSTOM PROCEDURE TRAY) ×1 IMPLANT
PATTIES SURGICAL .75X.75 (GAUZE/BANDAGES/DRESSINGS) ×1 IMPLANT
SOLUTION PRONTOSAN WOUND 350ML (IRRIGATION / IRRIGATOR) IMPLANT
SPONGE SURGIFOAM ABS GEL 100 (HEMOSTASIS) ×1 IMPLANT
SPONGE T-LAP 4X18 ~~LOC~~+RFID (SPONGE) IMPLANT
STAPLER VISISTAT (STAPLE) IMPLANT
STRIP CLOSURE SKIN 1/2X4 (GAUZE/BANDAGES/DRESSINGS) ×1 IMPLANT
SUT NURALON 4 0 TR CR/8 (SUTURE) IMPLANT
SUT PROLENE 3 0 PS 2 (SUTURE) IMPLANT
SUT VIC AB 1 CT1 27XBRD ANTBC (SUTURE) IMPLANT
SUT VIC AB 1-0 CT2 27 (SUTURE) IMPLANT
SUT VIC AB 2-0 CT1 TAPERPNT 27 (SUTURE) IMPLANT
SUT VIC AB 2-0 CT2 27 (SUTURE) IMPLANT
SYR 3ML LL SCALE MARK (SYRINGE) ×1 IMPLANT
TOWEL GREEN STERILE (TOWEL DISPOSABLE) ×1 IMPLANT
TOWEL GREEN STERILE FF (TOWEL DISPOSABLE) ×1 IMPLANT
TRAY FOLEY MTR SLVR 16FR STAT (SET/KITS/TRAYS/PACK) ×1 IMPLANT
WIPE CHG 2% 2PK PREOPERATIVE (MISCELLANEOUS) ×1 IMPLANT
YANKAUER SUCT BULB TIP NO VENT (SUCTIONS) ×1 IMPLANT

## 2024-03-15 NOTE — Plan of Care (Signed)

## 2024-03-15 NOTE — Anesthesia Procedure Notes (Signed)
 Procedure Name: Intubation Date/Time: 03/15/2024 7:46 AM  Performed by: Mollie Olivia SAUNDERS, CRNAPre-anesthesia Checklist: Patient identified, Emergency Drugs available, Suction available and Patient being monitored Patient Re-evaluated:Patient Re-evaluated prior to induction Oxygen Delivery Method: Circle system utilized Preoxygenation: Pre-oxygenation with 100% oxygen Induction Type: IV induction Ventilation: Mask ventilation without difficulty Laryngoscope Size: Glidescope and 3 Grade View: Grade I Tube type: Oral Number of attempts: 1 Airway Equipment and Method: Rigid stylet Placement Confirmation: ETT inserted through vocal cords under direct vision, positive ETCO2 and breath sounds checked- equal and bilateral Secured at: 22 cm Tube secured with: Tape Dental Injury: Teeth and Oropharynx as per pre-operative assessment

## 2024-03-15 NOTE — Discharge Instructions (Signed)
 Walk As Tolerated utilizing back precautions.  No bending, twisting, or lifting.  No driving for 2 weeks.   Aquacel dressing may remain in place until follow up. May shower with aquacel dressing in place. If the dressing peels off or becomes saturated, you may remove aquacel dressing and place gauze and tape dressing which should be kept clean and dry and changed daily. Do not remove steri-strips if they are present. See Dr. Duwayne in office in 10 to 14 days. Begin taking aspirin 81mg  per day starting 4 days after your surgery if not allergic to aspirin or on another blood thinner. Walk daily even outside. Use a cane or walker only if necessary. Avoid sitting on soft sofas.

## 2024-03-15 NOTE — Transfer of Care (Signed)
 Immediate Anesthesia Transfer of Care Note  Patient: Debbie Montoya  Procedure(s) Performed: DECOMPRESSIVE LUMBAR LAMINECTOMY LUMBAR THREE-FOUR  Patient Location: PACU  Anesthesia Type:General  Level of Consciousness: awake, alert , oriented, and drowsy  Airway & Oxygen Therapy: Patient Spontanous Breathing and Patient connected to face mask oxygen  Post-op Assessment: Report given to RN, Post -op Vital signs reviewed and stable, and Patient moving all extremities X 4  Post vital signs: Reviewed and stable  Last Vitals:  Vitals Value Taken Time  BP 116/67 03/15/24 09:51  Temp    Pulse 105 03/15/24 09:59  Resp 15 03/15/24 09:59  SpO2 93 % 03/15/24 09:59  Vitals shown include unfiled device data.  Last Pain:  Vitals:   03/15/24 0617  TempSrc:   PainSc: 7       Patients Stated Pain Goal: 3 (03/15/24 0617)  Complications: No notable events documented.

## 2024-03-15 NOTE — Brief Op Note (Signed)
  03/15/2024  7:12 AM  PATIENT:  Debbie Montoya  88 y.o. female  PRE-OPERATIVE DIAGNOSIS:  Stenosis L3-4  POST-OPERATIVE DIAGNOSIS:  * No post-op diagnosis entered *  PROCEDURE:  Procedure(s) with comments: DECOMPRESSIVE LUMBAR LAMINECTOMY LEVEL 1 (N/A) - Central Laminectomy L3-4  SURGEON:  Surgeons and Role:    DEWAINE Duwayne Purchase, MD - Primary  PHYSICIAN ASSISTANT:   ASSISTANTS: Bissell   ANESTHESIA:   general  EBL:  75   BLOOD ADMINISTERED:none  DRAINS: none   LOCAL MEDICATIONS USED:  MARCAINE      SPECIMEN:  No Specimen  DISPOSITION OF SPECIMEN:  N/A  COUNTS:  YES  TOURNIQUET:  * No tourniquets in log *  DICTATION: .Other Dictation: Dictation Number [id number]  PLAN OF CARE: Admit for overnight observation  PATIENT DISPOSITION:  PACU - hemodynamically stable.   Delay start of Pharmacological VTE agent (>24hrs) due to surgical blood loss or risk of bleeding: yes

## 2024-03-15 NOTE — Interval H&P Note (Signed)
 History and Physical Interval Note:  03/15/2024 7:11 AM  Debbie Montoya  has presented today for surgery, with the diagnosis of Stenosis L3-4.  The various methods of treatment have been discussed with the patient and family. After consideration of risks, benefits and other options for treatment, the patient has consented to  Procedure(s) with comments: DECOMPRESSIVE LUMBAR LAMINECTOMY LEVEL 1 (N/A) - Central Laminectomy L3-4 as a surgical intervention.  The patient's history has been reviewed, patient examined, no change in status, stable for surgery.  I have reviewed the patient's chart and labs.  Questions were answered to the patient's satisfaction.     Reyes JAYSON Billing

## 2024-03-16 ENCOUNTER — Encounter (HOSPITAL_COMMUNITY): Payer: Self-pay | Admitting: Specialist

## 2024-03-16 DIAGNOSIS — R269 Unspecified abnormalities of gait and mobility: Secondary | ICD-10-CM | POA: Diagnosis not present

## 2024-03-16 DIAGNOSIS — M48062 Spinal stenosis, lumbar region with neurogenic claudication: Secondary | ICD-10-CM | POA: Diagnosis not present

## 2024-03-16 LAB — COMPREHENSIVE METABOLIC PANEL WITH GFR
ALT: 30 U/L (ref 0–44)
AST: 30 U/L (ref 15–41)
Albumin: 3 g/dL — ABNORMAL LOW (ref 3.5–5.0)
Alkaline Phosphatase: 46 U/L (ref 38–126)
Anion gap: 10 (ref 5–15)
BUN: 12 mg/dL (ref 8–23)
CO2: 24 mmol/L (ref 22–32)
Calcium: 9.1 mg/dL (ref 8.9–10.3)
Chloride: 104 mmol/L (ref 98–111)
Creatinine, Ser: 0.83 mg/dL (ref 0.44–1.00)
GFR, Estimated: >60 mL/min (ref >60)
Glucose, Bld: 171 mg/dL — ABNORMAL HIGH (ref 70–99)
Potassium: 4 mmol/L (ref 3.5–5.1)
Sodium: 138 mmol/L (ref 135–145)
Total Bilirubin: 0.2 mg/dL (ref 0.0–1.2)
Total Protein: 5.6 g/dL — ABNORMAL LOW (ref 6.5–8.1)

## 2024-03-16 MED ORDER — POLYETHYLENE GLYCOL 3350 17 G PO PACK: 17.0000 g | PACK | Freq: Every day | ORAL | 0 refills | Status: AC

## 2024-03-16 MED ORDER — DOCUSATE SODIUM 100 MG PO CAPS
100.0000 mg | ORAL_CAPSULE | Freq: Two times a day (BID) | ORAL | 2 refills | Status: AC
Start: 2024-03-16 — End: 2025-03-16

## 2024-03-16 MED ORDER — OXYCODONE HCL 5 MG PO TABS: 5.0000 mg | ORAL_TABLET | ORAL | 0 refills | Status: AC | PRN

## 2024-03-16 NOTE — Progress Notes (Signed)
 Patient awaiting family for discharge home, Patient in no acute distress nor complaints of pain nor discomfort; incision on back is clean, dry and intact; No c/o pain at this time. Room was checked and accounted for all patient's belongings; discharge instructions concerning her medications, incision care, follow up appointment and when to call the doctor as needed were all discussed with patient by RN and she expressed understanding on the instructions given.

## 2024-03-16 NOTE — TOC Transition Note (Signed)
 Transition of Care [facility]) - Discharge Note   Patient Details  Name: AOLANI PIGGOTT MRN: [id number] Date of Birth: Nov 29, 1935  Transition of Care [facility]) CM/SW Contact:  Andrez JULIANNA George, RN Phone Number: 03/16/2024, 10:06 AM   Clinical Narrative:     Pt is discharging home with home health through [facility]. Information on the AVS. Hedda will contact her for the first home visit.  Pt has transportation home.  Final next level of care: Home w Home Health Services Barriers to Discharge: No Barriers Identified   Patient Goals and CMS Choice   CMS Medicare.gov Compare Post Acute Care list provided to:: Patient Choice offered to / list presented to : Patient      Discharge Placement                       Discharge Plan and Services Additional resources added to the After Visit Summary for                            [facility] Arranged: PT [facility] Agency: [facility] Date [facility] Agency Contacted: 03/16/24   Representative spoke with at [facility] Agency: Darleene  Social Drivers of Health (SDOH) Interventions SDOH Screenings   Depression (PHQ2-9): Low Risk  (10/03/2018)  Tobacco Use: Low Risk  (03/15/2024)     Readmission Risk Interventions     No data to display

## 2024-03-16 NOTE — Progress Notes (Signed)
 Physical Therapy Treatment  Patient Details Name: Debbie Montoya MRN: [id number] DOB: January 03, 1936 Today's Date: 03/16/2024   History of Present Illness Patient is an 88 y/o female presenting for surgery on 03/15/24 for laminectomy and fusion to L3-L4. PMH includes: DM2, pancreatitis, anxiety, spinal surgery (L4-5 PLIF 04/19/12; C5-7 ACDF 11/18/15).    PT Comments  Pt progressing well with post-op mobility. She was able to demonstrate transfers and ambulation with gross CGA to light min assist and RW for support. Reinforced education on precautions, positioning recommendations, appropriate activity progression, and car transfer. Will continue to follow.     Orthostatic BPs  Sitting 116/55  Standing 97/64 after ambulation      If plan is discharge home, recommend the following: A lot of help with walking and/or transfers;A little help with bathing/dressing/bathroom;Assistance with cooking/housework;Assist for transportation;Help with stairs or ramp for entrance   Can travel by private vehicle        Equipment Recommendations  Rolling walker (2 wheels)    Recommendations for Other Services       Precautions / Restrictions Precautions Precautions: Back Precaution Booklet Issued: Yes (comment) Recall of Precautions/Restrictions: Intact Precaution/Restrictions Comments: Reviewed handout and pt was cued for precautions during functional mobility. Required Braces or Orthoses:  (No brace needed order) Restrictions Weight Bearing Restrictions Per Provider Order: No     Mobility  Bed Mobility Overal bed mobility: Needs Assistance Bed Mobility: Sidelying to Sit, Rolling Rolling: Supervision Sidelying to sit: Min assist       General bed mobility comments: Pt was received sitting up in the recliner. BP 116/55 prior to mobility.    Transfers Overall transfer level: Needs assistance Equipment used: Rolling walker (2 wheels) Transfers: Sit to/from Stand Sit to Stand: Min assist            General transfer comment: Light assist to power up to full stand. No unsteadiness or LOB noted.    Ambulation/Gait Ambulation/Gait assistance: Contact guard assist Gait Distance (Feet): 175 Feet Assistive device: Rolling walker (2 wheels) Gait Pattern/deviations: Step-through pattern, Decreased stride length, Knees buckling, Trunk flexed, Narrow base of support Gait velocity: Decreased Gait velocity interpretation: 1.31 - 2.62 ft/sec, indicative of limited community ambulator   General Gait Details: Pt able to ambulate in the hallway without difficulty. Hands on guarding provided for safety throughout, however pt did not demonstrate any LOB. Reports mild lightheadedness. BP upon return to room 97/64.   Stairs Stairs: Yes Stairs assistance: Contact guard assist Stair Management: Two rails, Step to pattern, Forwards Number of Stairs: 5 General stair comments: VC's for sequencing and general safety.   Wheelchair Mobility     Tilt Bed    Modified Rankin (Stroke Patients Only)       Balance Overall balance assessment: Needs assistance Sitting-balance support: Bilateral upper extremity supported, Feet supported Sitting balance-Leahy Scale: Fair   Postural control: Posterior lean Standing balance support: Bilateral upper extremity supported, During functional activity, Reliant on assistive device for balance Standing balance-Leahy Scale: Poor Standing balance comment: max assist required at times                            Communication Communication Communication: No apparent difficulties  Cognition Arousal: Alert Behavior During Therapy: Anxious   PT - Cognitive impairments: No apparent impairments                         Following commands: Impaired Following  commands impaired: Follows multi-step commands inconsistently    Cueing Cueing Techniques: Verbal cues, Tactile cues  Exercises      General Comments General comments (skin  integrity, edema, etc.): Pt reports feeling swimmy headed upon sitting EOB at end of mobility trial. BP taken and as follows: 97/56 in sitting, 87/47 in standing, 93/55 supine end of session.      Pertinent Vitals/Pain Pain Assessment Pain Assessment: Faces Faces Pain Scale: Hurts little more Pain Location: back Pain Descriptors / Indicators: Discomfort, Grimacing, Guarding Pain Intervention(s): Limited activity within patient's tolerance, Monitored during session, Repositioned    Home Living Family/patient expects to be discharged to:: Private residence Living Arrangements: Spouse/significant other Available Help at Discharge: Friend(s);Family;Available 24 hours/day Type of Home: House Home Access: Stairs to enter Entrance Stairs-Rails: Doctor, general practice of Steps: 5   Home Layout: One level Home Equipment: Rollator (4 wheels);Cane - single point;Shower seat Additional Comments: husband has Alzhiemers and cannot provide physical assist    Prior Function            PT Goals (current goals can now be found in the care plan section) Acute Rehab PT Goals Patient Stated Goal: Be able to take care of herself at home PT Goal Formulation: With patient Time For Goal Achievement: 03/23/24 Potential to Achieve Goals: Good Progress towards PT goals: Progressing toward goals    Frequency    Min 5X/week      PT Plan      Co-evaluation              AM-PAC PT 6 Clicks Mobility   Outcome Measure  Help needed turning from your back to your side while in a flat bed without using bedrails?: A Little Help needed moving from lying on your back to sitting on the side of a flat bed without using bedrails?: A Little Help needed moving to and from a bed to a chair (including a wheelchair)?: A Little Help needed standing up from a chair using your arms (e.g., wheelchair or bedside chair)?: A Little Help needed to walk in hospital room?: A Little Help needed  climbing 3-5 steps with a railing? : A Little 6 Click Score: 18    End of Session Equipment Utilized During Treatment: Gait belt Activity Tolerance: Treatment limited secondary to medical complications (Comment) (hypotension vs orthostatics) Patient left: in bed;with call bell/phone within reach;with bed alarm set Nurse Communication: Mobility status PT Visit Diagnosis: Unsteadiness on feet (R26.81);Pain Pain - part of body:  (back)     Time: 8685-8663 PT Time Calculation (min) (ACUTE ONLY): 22 min  Charges:    $Gait Training: 8-22 mins PT General Charges $$ ACUTE PT VISIT: 1 Visit                     Leita Sable, PT, DPT Acute Rehabilitation Services Secure Chat Preferred Office: [phone]    Leita JONETTA Sable 03/16/2024, 2:02 PM

## 2024-03-16 NOTE — Anesthesia Postprocedure Evaluation (Signed)
 Anesthesia Post Note  Patient: Debbie Montoya  Procedure(s) Performed: DECOMPRESSIVE LUMBAR LAMINECTOMY LUMBAR THREE-FOUR     Patient location during evaluation: PACU Anesthesia Type: General Level of consciousness: sedated and patient cooperative Pain management: pain level controlled Vital Signs Assessment: post-procedure vital signs reviewed and stable Respiratory status: spontaneous breathing Cardiovascular status: stable Anesthetic complications: no   No notable events documented.  Last Vitals:  Vitals:   03/16/24 0207 03/16/24 0731  BP: 120/78 (!) 109/52  Pulse: (!) 55 (!) 51  Resp: 20 19  Temp:  36.5 C  SpO2: 99% 98%    Last Pain:  Vitals:   03/16/24 0731  TempSrc: Oral  PainSc:                  Norleen Pope

## 2024-03-16 NOTE — Evaluation (Signed)
 Physical Therapy Evaluation  Patient Details Name: Debbie Montoya MRN: [id number] DOB: August 21, 1935 Today's Date: 03/16/2024  History of Present Illness  Patient is an 88 y/o female presenting for surgery on 03/15/24 for laminectomy and fusion to L3-L4. PMH includes: DM2, pancreatitis, anxiety, spinal surgery (L4-5 PLIF 04/19/12; C5-7 ACDF 11/18/15).  Clinical Impression  Pt admitted with above diagnosis. At the time of PT eval, pt was able to demonstrate transfers and ambulation with up to max assist due to knee buckling. Pt's BP noted to be low after pt reports feeling swimmy-headed. Pt was educated on precautions. Pt currently with functional limitations due to the deficits listed below (see PT Problem List). Pt will benefit from skilled PT to increase their independence and safety with mobility to allow discharge to the venue listed below.      Orthostatic BPs  Supine 93/55 (end of session)  Sitting 97/56  Standing 87/47         If plan is discharge home, recommend the following: A lot of help with walking and/or transfers;A little help with bathing/dressing/bathroom;Assistance with cooking/housework;Assist for transportation;Help with stairs or ramp for entrance   Can travel by private vehicle        Equipment Recommendations Rolling walker (2 wheels)  Recommendations for Other Services       Functional Status Assessment Patient has had a recent decline in their functional status and demonstrates the ability to make significant improvements in function in a reasonable and predictable amount of time.     Precautions / Restrictions Precautions Precautions: Back Precaution Booklet Issued: Yes (comment) Recall of Precautions/Restrictions: Intact Precaution/Restrictions Comments: Reviewed handout and pt was cued for precautions during functional mobility. Required Braces or Orthoses:  (No brace needed order) Restrictions Weight Bearing Restrictions Per Provider Order: No       Mobility  Bed Mobility Overal bed mobility: Needs Assistance Bed Mobility: Sidelying to Sit, Rolling Rolling: Supervision Sidelying to sit: Min assist       General bed mobility comments: Assist for LE elevation to full sitting position.    Transfers Overall transfer level: Needs assistance Equipment used: Rolling walker (2 wheels) Transfers: Sit to/from Stand Sit to Stand: Min assist           General transfer comment: Light assist to power up to full stand. Noted posterior lean, and knee buckling, and assist required to recover.    Ambulation/Gait Ambulation/Gait assistance: Max assist Gait Distance (Feet): 3 Feet Assistive device: Rolling walker (2 wheels) Gait Pattern/deviations: Step-through pattern, Decreased stride length, Knees buckling, Trunk flexed, Narrow base of support Gait velocity: Decreased Gait velocity interpretation: <1.31 ft/sec, indicative of household ambulator   General Gait Details: Pt began ambulating impulsively despite cues to wait as knees were buckling in standing. Pt's knees buckling more with each step and pt reached out for footboard of bed for external support. PT provided max assist as pt sat on therapist's leg for a short bout while she recovered. Pt was able to be assisted back into standing, and turned slightly to back up to sit at EOB safely.  Stairs            Wheelchair Mobility     Tilt Bed    Modified Rankin (Stroke Patients Only)       Balance Overall balance assessment: Needs assistance Sitting-balance support: Bilateral upper extremity supported, Feet supported Sitting balance-Leahy Scale: Fair   Postural control: Posterior lean Standing balance support: Bilateral upper extremity supported, During functional activity, Reliant on assistive  device for balance Standing balance-Leahy Scale: Zero Standing balance comment: max assist required at times                             Pertinent  Vitals/Pain Pain Assessment Pain Assessment: Faces Faces Pain Scale: Hurts little more Pain Location: back Pain Descriptors / Indicators: Discomfort, Grimacing, Guarding Pain Intervention(s): Limited activity within patient's tolerance, Monitored during session, Repositioned    Home Living Family/patient expects to be discharged to:: Private residence Living Arrangements: Spouse/significant other Available Help at Discharge: Friend(s);Family;Available 24 hours/day Type of Home: House Home Access: Stairs to enter Entrance Stairs-Rails: Doctor, general practice of Steps: 5   Home Layout: One level Home Equipment: Rollator (4 wheels);Cane - single point;Shower seat Additional Comments: husband has Alzhiemers and cannot provide physical assist    Prior Function Prior Level of Function : Independent/Modified Independent             Mobility Comments: independent ADLs Comments: independent     Extremity/Trunk Assessment   Upper Extremity Assessment Upper Extremity Assessment: Right hand dominant    Lower Extremity Assessment Lower Extremity Assessment: Generalized weakness    Cervical / Trunk Assessment Cervical / Trunk Assessment: Back Surgery  Communication   Communication Communication: No apparent difficulties    Cognition Arousal: Alert Behavior During Therapy: Anxious   PT - Cognitive impairments: No apparent impairments                         Following commands: Impaired Following commands impaired: Follows multi-step commands inconsistently     Cueing Cueing Techniques: Verbal cues, Tactile cues     General Comments General comments (skin integrity, edema, etc.): Pt reports feeling swimmy headed upon sitting EOB at end of mobility trial. BP taken and as follows: 97/56 in sitting, 87/47 in standing, 93/55 supine end of session.    Exercises     Assessment/Plan    PT Assessment Patient needs continued PT services  PT Problem  List Decreased strength;Decreased activity tolerance;Decreased balance;Decreased mobility;Decreased knowledge of use of DME;Decreased safety awareness;Decreased knowledge of precautions;Pain       PT Treatment Interventions DME instruction;Gait training;Stair training;Functional mobility training;Therapeutic activities;Therapeutic exercise;Balance training;Patient/family education    PT Goals (Current goals can be found in the Care Plan section)  Acute Rehab PT Goals Patient Stated Goal: Be able to take care of herself at home PT Goal Formulation: With patient Time For Goal Achievement: 03/23/24 Potential to Achieve Goals: Good    Frequency Min 5X/week     Co-evaluation               AM-PAC PT 6 Clicks Mobility  Outcome Measure Help needed turning from your back to your side while in a flat bed without using bedrails?: A Little Help needed moving from lying on your back to sitting on the side of a flat bed without using bedrails?: A Little Help needed moving to and from a bed to a chair (including a wheelchair)?: A Little Help needed standing up from a chair using your arms (e.g., wheelchair or bedside chair)?: A Little Help needed to walk in hospital room?: Total Help needed climbing 3-5 steps with a railing? : Total 6 Click Score: 14    End of Session Equipment Utilized During Treatment: Gait belt Activity Tolerance: Treatment limited secondary to medical complications (Comment) (hypotension vs orthostatics) Patient left: in bed;with call bell/phone within reach;with bed alarm set Nurse  Communication: Mobility status PT Visit Diagnosis: Unsteadiness on feet (R26.81);Pain Pain - part of body:  (back)    Time: 9069-9051 PT Time Calculation (min) (ACUTE ONLY): 18 min   Charges:   PT Evaluation $PT Eval Moderate Complexity: 1 Mod   PT General Charges $$ ACUTE PT VISIT: 1 Visit         Leita Sable, PT, DPT Acute Rehabilitation Services Secure Chat  Preferred Office: [phone]   Leita JONETTA Sable 03/16/2024, 12:08 PM

## 2024-03-16 NOTE — Discharge Summary (Signed)
 Physician Discharge Summary   Patient ID: Debbie Montoya MRN: [id number] DOB/AGE: [birth year] 88 y.o.  Admit date: 03/15/2024 Discharge date: 03/16/2024  Primary Diagnosis:   Stenosis Lumbar three-four  Admission Diagnoses:  Past Medical History:  Diagnosis Date   Anxiety    Arthritis    lumbar spondylosis   Colitis    Diabetes mellitus    diet controlled   Fever blister    GERD (gastroesophageal reflux disease)    Neuromuscular disorder (HCC)    nerve pain on L leg from back    Pancreatitis    1980's, pt. reports that she was hospitalized for 3 weeks, [facility]   PONV (postoperative nausea and vomiting)    on previous back surgery, didn't last long   Discharge Diagnoses:   Principal Problem:   Spinal stenosis of lumbar region with radiculopathy  Procedure:  Procedure(s) (LRB): DECOMPRESSIVE LUMBAR LAMINECTOMY LUMBAR THREE-FOUR (N/A)   Consults: None  HPI:  See H&P    Laboratory Data: Hospital Outpatient Visit on 03/09/2024  Component Date Value Ref Range Status   Glucose-Capillary 03/09/2024 130 (H)  70 - 99 mg/dL Final   Glucose reference range applies only to samples taken after fasting for at least 8 hours.   MRSA, PCR 03/09/2024 NEGATIVE  NEGATIVE Final   Staphylococcus aureus 03/09/2024 POSITIVE (A)  NEGATIVE Final   Comment: (NOTE) The Xpert SA Assay (FDA approved for NASAL specimens in patients 104 years of age and older), is one component of a comprehensive surveillance program. It is not intended to diagnose infection nor to guide or monitor treatment. Performed at [facility], [address], [place], [place] [address]    Sodium 03/09/2024 139  135 - 145 mmol/L Final   Potassium 03/09/2024 4.6  3.5 - 5.1 mmol/L Final   HEMOLYSIS AT THIS LEVEL MAY AFFECT RESULT   Chloride 03/09/2024 104  98 - 111 mmol/L Final   CO2 03/09/2024 25  22 - 32 mmol/L Final   Glucose, Bld 03/09/2024 119 (H)  70 - 99 mg/dL Final   Glucose reference range applies only to  samples taken after fasting for at least 8 hours.   BUN 03/09/2024 14  8 - 23 mg/dL Final   Creatinine, Ser 03/09/2024 0.71  0.44 - 1.00 mg/dL Final   Calcium 91/91/7974 9.1  8.9 - 10.3 mg/dL Final   GFR, Estimated 03/09/2024 >60  >60 mL/min Final   Comment: (NOTE) Calculated using the CKD-EPI Creatinine Equation (2021)    Anion gap 03/09/2024 10  5 - 15 Final   Performed at [facility], [address] [place], [place] [address]   WBC 03/09/2024 7.1  4.0 - 10.5 K/uL Final   RBC 03/09/2024 4.73  3.87 - 5.11 MIL/uL Final   Hemoglobin 03/09/2024 14.9  12.0 - 15.0 g/dL Final   HCT 91/91/7974 45.8  36.0 - 46.0 % Final   MCV 03/09/2024 96.8  80.0 - 100.0 fL Final   MCH 03/09/2024 31.5  26.0 - 34.0 pg Final   MCHC 03/09/2024 32.5  30.0 - 36.0 g/dL Final   RDW 91/91/7974 12.0  11.5 - 15.5 % Final   Platelets 03/09/2024 250  150 - 400 K/uL Final   nRBC 03/09/2024 0.0  0.0 - 0.2 % Final   Performed at [facility], [address] [place], [place] [address]   No results for input(s): HGB in the last 72 hours. No results for input(s): WBC, RBC, HCT, PLT in the last  72 hours. No results for input(s): NA, K, CL, CO2, BUN, CREATININE, GLUCOSE, CALCIUM in the last 72 hours. No results for input(s): LABPT, INR in the last 72 hours.  X-Rays:DG Lumbar Spine 2-3 Views Result Date: 03/15/2024 CLINICAL DATA:  Elective surgery.  Intraop localization. EXAM: LUMBAR SPINE - 2-3 VIEW COMPARISON:  Radiograph 03/09/2024 FINDINGS: Three lateral spot views of the lumbar spine submitted from the operating room. Previous L4-L5 fusion with interbody spacer. Image 1 demonstrates surgical instruments with tips projecting posterior to the L2 and L3 spinous processes. Image 2 demonstrates surgical instruments posteriorly at the L3-L4 and L5 levels. Image 3 demonstrates surgical instrument projecting over the posterior aspect of the L3-L4 disc space. IMPRESSION: Intraoperative  localization during lumbar surgery. Electronically Signed   By: Andrea Gasman M.D.   On: 03/15/2024 10:58   DG Lumbar Spine 2-3 Views Result Date: 03/10/2024 CLINICAL DATA:  Herniated nucleus polyposis, preop. EXAM: LUMBAR SPINE - 2-3 VIEW COMPARISON:  Lumbar spine CT 12/13/2023 FINDINGS: Five non-rib-bearing lumbar vertebra. Posterior rod and pedicle screw fixation with interbody spacer at L4-L5. There is trace fixated anterolisthesis at L4-L5. Grade 1 anterolisthesis of L3 on L4. L5-S1 disc space narrowing and spurring. L3-L4 facet hypertrophy. No evidence of fracture or compression deformity. Vertebral bodies were labeled. IMPRESSION: 1. Posterior fusion at L4-L5. 2. Grade 1 anterolisthesis of L3 on L4 with moderate facet hypertrophy at this level. 3. L5-S1 degenerative disc disease. Electronically Signed   By: Andrea Gasman M.D.   On: 03/10/2024 17:46    EKG: Orders placed or performed during the hospital encounter of 09/25/15   EKG 12-Lead   EKG 12-Lead   Repeat EKG   Repeat EKG   EKG 12-Lead   EKG 12-Lead   EKG     Hospital Course: Patient was admitted to [facility] and taken to the OR and underwent the above state procedure without complications.  Patient tolerated the procedure well and was later transferred to the recovery room and then to the orthopaedic floor for postoperative care.  They were given PO and IV analgesics for pain control following their surgery.  They were given 24 hours of postoperative antibiotics.   PT was consulted postop to assist with mobility and transfers.  The patient was allowed to be WBAT with therapy and was taught back precautions. Discharge planning was consulted to help with postop disposition and equipment needs.  Patient had a fair night on the evening of surgery and started to get up OOB with therapy on day one. Patient was seen in rounds and was ready to go home on day one.  They were given discharge instructions and dressing directions.   They were instructed on when to follow up in the office with Dr. Duwayne.   Diet: Regular diet Activity:WBAT, lumbar precautions Follow-up:in 10-14 days Disposition - Home Discharged Condition: good   Discharge Instructions     Call MD / Call 911   Complete by: As directed    If you experience chest pain or shortness of breath, CALL 911 and be transported to the hospital emergency room.  If you develope a fever above 101 F, pus (white drainage) or increased drainage or redness at the wound, or calf pain, call your surgeon's office.   Constipation Prevention   Complete by: As directed    Drink plenty of fluids.  Prune juice may be helpful.  You may use a stool softener, such as Colace (over the counter) 100 mg twice a day.  Use MiraLax  (over the counter) for constipation as needed.   Diet - low sodium heart healthy   Complete by: As directed    Increase activity slowly as tolerated   Complete by: As directed    Post-operative opioid taper instructions:   Complete by: As directed    POST-OPERATIVE OPIOID TAPER INSTRUCTIONS: It is important to wean off of your opioid medication as soon as possible. If you do not need pain medication after your surgery it is ok to stop day one. Opioids include: Codeine, Hydrocodone (Norco, Vicodin), Oxycodone (Percocet, oxycontin ) and hydromorphone  amongst others.  Long term and even short term use of opiods can cause: Increased pain response Dependence Constipation Depression Respiratory depression And more.  Withdrawal symptoms can include Flu like symptoms Nausea, vomiting And more Techniques to manage these symptoms Hydrate well Eat regular healthy meals Stay active Use relaxation techniques(deep breathing, meditating, yoga) Do Not substitute Alcohol to help with tapering If you have been on opioids for less than two weeks and do not have pain than it is ok to stop all together.  Plan to wean off of opioids This plan should start within one  week post op of your joint replacement. Maintain the same interval or time between taking each dose and first decrease the dose.  Cut the total daily intake of opioids by one tablet each day Next start to increase the time between doses. The last dose that should be eliminated is the evening dose.         Allergies as of 03/16/2024       Reactions   Tape Other (See Comments)   Tender skin   Penicillins Swelling, Rash   Has patient had a PCN reaction causing immediate rash, facial/tongue/throat swelling, SOB or lightheadedness with hypotension: Unknown Has patient had a PCN reaction causing severe rash involving mucus membranes or skin necrosis: No Has patient had a PCN reaction that required hospitalization No Has patient had a PCN reaction occurring within the last 10 years: No If all of the above answers are NO, then may proceed with Cephalosporin use.        Medication List     STOP taking these medications    HYDROcodone -acetaminophen  10-325 MG tablet Commonly known as: NORCO   Voltaren 1 % Gel Generic drug: diclofenac Sodium       TAKE these medications    buPROPion  300 MG 24 hr tablet Commonly known as: WELLBUTRIN  XL Take 300 mg by mouth every morning.   docusate sodium  100 MG capsule Commonly known as: Colace Take 1 capsule (100 mg total) by mouth 2 (two) times daily.   fenofibrate  160 MG tablet Take 160 mg by mouth daily before breakfast.   FLUoxetine  40 MG capsule Commonly known as: PROZAC  Take 40 mg by mouth daily.   fluticasone  50 MCG/ACT nasal spray Commonly known as: FLONASE  Place 2 sprays into both nostrils daily as needed for allergies or rhinitis.   gabapentin  300 MG capsule Commonly known as: NEURONTIN  Take 300 mg by mouth 2 (two) times daily.   LORazepam  1 MG tablet Commonly known as: ATIVAN  Take 1 mg by mouth daily as needed for anxiety.   omeprazole 20 MG capsule Commonly known as: PRILOSEC Take 20 mg by mouth daily before  breakfast.   oxyCODONE  5 MG immediate release tablet Commonly known as: Oxy IR/ROXICODONE  Take 1 tablet (5 mg total) by mouth every 4 (four) hours as needed for severe pain (pain score 7-10).   polyethylene glycol 17 g  packet Commonly known as: MIRALAX  / GLYCOLAX  Take 17 g by mouth daily.   tiZANidine  4 MG tablet Commonly known as: ZANAFLEX  Take 1 tablet (4 mg total) by mouth every 6 (six) hours as needed for muscle spasms.   valACYclovir  1000 MG tablet Commonly known as: VALTREX  Take 1,000 mg by mouth 2 (two) times daily as needed (for fever blisters). Patient takes two pills at first sign of fever blister and then two more tablets 12hours later.         Signed: Fariha Goto, PA-C Orthopaedic Surgery 03/16/2024, 8:26 AM

## 2024-03-16 NOTE — Progress Notes (Signed)
 Subjective: 1 Day Post-Op Procedure(s) (LRB): DECOMPRESSIVE LUMBAR LAMINECTOMY LUMBAR THREE-FOUR (N/A) Patient reports pain as moderate.  Reports radicular symptoms improved. No other c/o. Voiding without difficulty. Ready to go home.  Objective: Vital signs in last 24 hours: Temp:  [97.7 F (36.5 C)-97.9 F (36.6 C)] 97.7 F (36.5 C) (08/15 0731) Pulse Rate:  [51-112] 51 (08/15 0731) Resp:  [11-20] 19 (08/15 0731) BP: (109-136)/(52-80) 109/52 (08/15 0731) SpO2:  [93 %-100 %] 98 % (08/15 0731)  Intake/Output from previous day: 08/14 0701 - 08/15 0700 In: 1540 [P.O.:240; I.V.:1000; IV Piggyback:300] Out: 675 [Urine:600; Blood:75] Intake/Output this shift: Total I/O In: 240 [P.O.:240] Out: -   No results for input(s): HGB in the last 72 hours. No results for input(s): WBC, RBC, HCT, PLT in the last 72 hours. No results for input(s): NA, K, CL, CO2, BUN, CREATININE, GLUCOSE, CALCIUM in the last 72 hours. No results for input(s): LABPT, INR in the last 72 hours.  Neurologically intact ABD soft Neurovascular intact Sensation intact distally Intact pulses distally Dorsiflexion/Plantar flexion intact Incision: dressing C/D/I and no drainage No cellulitis present Compartment soft No calf pain or sign of DVT   Assessment/Plan: 1 Day Post-Op Procedure(s) (LRB): DECOMPRESSIVE LUMBAR LAMINECTOMY LUMBAR THREE-FOUR (N/A) Advance diet Up with therapy D/C IV fluids Discussed D/C instructions, dressing instructions, lumbar precautions D/C to home today   Tres Grzywacz M Aeron Donaghey 03/16/2024, 8:24 AM

## 2024-03-16 NOTE — Evaluation (Signed)
 Occupational Therapy Evaluation Patient Details Name: Debbie Montoya MRN: [id number] DOB: [birth year] Today's Date: 03/16/2024   History of Present Illness   Patient is an 88 yo female presenting for surgery on 03/15/24 for laminectomy and fusion to L3-L4 PMH includes: DM2, GERD, pancreatitis, anxiety, spinal surgery (L4-5 PLIF 04/19/12; C5-7 ACDF 11/18/15).     Clinical Impressions Prior to this admission, patient living with her husband and independent in ADLs and IADLs. Currently, patient presenting with back pain, decreased safety awareness, minimal cognitive impairments, and need for min A for ADL management. Patient with mulitple LOB when attempting to complete lower body dressing and decreased RW management. Patient able to complete minimal ambulation to bathroom, but continues to require cues for safety management. OT recommending RW for home and PT evaluation due to decreased balance. OT will continue to follow acutely.      If plan is discharge home, recommend the following:   A little help with walking and/or transfers;A little help with bathing/dressing/bathroom;Assist for transportation;Help with stairs or ramp for entrance;Assistance with cooking/housework     Functional Status Assessment   Patient has had a recent decline in their functional status and demonstrates the ability to make significant improvements in function in a reasonable and predictable amount of time.     Equipment Recommendations   Other (comment) (RW)     Recommendations for Other Services         Precautions/Restrictions   Precautions Precautions: Back Precaution Booklet Issued: Yes (comment) Recall of Precautions/Restrictions: Intact Restrictions Weight Bearing Restrictions Per Provider Order: No     Mobility Bed Mobility               General bed mobility comments: sitting EOB upon OT arrival    Transfers Overall transfer level: Needs assistance Equipment used: Rolling  walker (2 wheels) Transfers: Sit to/from Stand Sit to Stand: Min assist           General transfer comment: requiring min A to come into standing, multiple LOB and sitting back on the bed when attempting to put on pants, flopping on toilet when completing toilet transfer      Balance Overall balance assessment: Needs assistance Sitting-balance support: Bilateral upper extremity supported, Feet supported Sitting balance-Leahy Scale: Fair   Postural control: Posterior lean Standing balance support: Bilateral upper extremity supported, During functional activity, Reliant on assistive device for balance Standing balance-Leahy Scale: Poor Standing balance comment: reliant on RW                           ADL either performed or assessed with clinical judgement   ADL Overall ADL's : Needs assistance/impaired Eating/Feeding: Set up;Sitting   Grooming: Set up;Standing;Wash/dry hands   Upper Body Bathing: Contact guard assist;Sitting   Lower Body Bathing: Minimal assistance;Sit to/from stand;Sitting/lateral leans   Upper Body Dressing : Contact guard assist;Sitting   Lower Body Dressing: Minimal assistance;Sit to/from stand;Sitting/lateral leans   Toilet Transfer: Minimal assistance;Ambulation;Rolling walker (2 wheels) Toilet Transfer Details (indicate cue type and reason): cues for technique, minimal LOB in standing Toileting- Clothing Manipulation and Hygiene: Contact guard assist;Sit to/from stand;Sitting/lateral lean       Functional mobility during ADLs: Minimal assistance;Cueing for safety;Cueing for sequencing;Rolling walker (2 wheels) General ADL Comments: Prior to this admission, patient living with her husband and independent in ADLs and IADLs. Currently, patient presenting with back pain, decreased safety awareness, minimal cognitive impairments, and need for min A for ADL management. Patient  with mulitple LOB when attempting to complete lower body dressing and  decreased RW management. Patient able to complete minimal ambulation to bathroom, but continues to require cues for safety management. OT recommending RW for home and PT evaluation due to decreased balance. OT will continue to follow acutely.     Vision Baseline Vision/History: 1 Wears glasses Ability to See in Adequate Light: 0 Adequate Patient Visual Report: No change from baseline Vision Assessment?: Wears glasses for reading     Perception Perception: Within Functional Limits       Praxis Praxis: [facility]       Pertinent Vitals/Pain Pain Assessment Pain Assessment: Faces Faces Pain Scale: Hurts little more Pain Location: back Pain Descriptors / Indicators: Discomfort, Grimacing, Guarding Pain Intervention(s): Limited activity within patient's tolerance, Monitored during session, Repositioned     Extremity/Trunk Assessment Upper Extremity Assessment Upper Extremity Assessment: Right hand dominant;Overall [facility] for tasks assessed   Lower Extremity Assessment Lower Extremity Assessment: Defer to PT evaluation   Cervical / Trunk Assessment Cervical / Trunk Assessment: Back Surgery   Communication Communication Communication: No apparent difficulties   Cognition Arousal: Alert Behavior During Therapy: Anxious Cognition: Cognition impaired     Awareness: Online awareness impaired Memory impairment (select all impairments): Short-term memory Attention impairment (select first level of impairment): Selective attention Executive functioning impairment (select all impairments): Problem solving OT - Cognition Comments: safety impairments noted, decreased STM, cues for walker management                 Following commands: Impaired Following commands impaired: Follows multi-step commands inconsistently     Cueing  General Comments   Cueing Techniques: Verbal cues;Tactile cues      Exercises     Shoulder Instructions      Home Living Family/patient expects to be  discharged to:: Private residence Living Arrangements: Spouse/significant other Available Help at Discharge: Friend(s);Family;Available 24 hours/day Type of Home: House Home Access: Stairs to enter [employer] of Steps: 5 Entrance Stairs-Rails: Right;Left Home Layout: One level     Bathroom Shower/Tub: Producer, television/film/video: Handicapped height     Home Equipment: Rollator (4 wheels);Cane - single point;Shower seat   Additional Comments: husband has [place]ers and cannot provide physical assist      Prior Functioning/Environment Prior Level of Function : Independent/Modified Independent             Mobility Comments: independent ADLs Comments: independent    OT Problem List: Decreased strength;Decreased range of motion;Decreased activity tolerance;Impaired balance (sitting and/or standing);Decreased coordination;Decreased safety awareness;Decreased knowledge of use of DME or AE;Decreased knowledge of precautions;Pain   OT Treatment/Interventions: Self-care/ADL training;Therapeutic exercise;DME and/or AE instruction;Patient/family education;Balance training;Cognitive remediation/compensation;Energy conservation      OT Goals(Current goals can be found in the care plan section)   Acute Rehab OT Goals Patient Stated Goal: to get better OT Goal Formulation: With patient Time For Goal Achievement: 03/30/24 Potential to Achieve Goals: Good ADL Goals Pt Will Perform Lower Body Bathing: with modified independence;sitting/lateral leans;sit to/from stand Pt Will Perform Lower Body Dressing: with modified independence;sit to/from stand;sitting/lateral leans Pt Will Transfer to Toilet: with modified independence;ambulating;grab bars;regular height toilet Pt Will Perform Toileting - Clothing Manipulation and hygiene: with modified independence;sitting/lateral leans;sit to/from stand Additional ADL Goal #1: Patient will be able to verbalize and adhere to back  precautions without cues in order to promote functional independence with ADLs.   OT Frequency:  Min 4X/week    Co-evaluation  AM-PAC OT 6 Clicks Daily Activity     Outcome Measure Help from another person eating meals?: A Little Help from another person taking care of personal grooming?: A Little Help from another person toileting, which includes using toliet, bedpan, or urinal?: A Little Help from another person bathing (including washing, rinsing, drying)?: A Little Help from another person to put on and taking off regular upper body clothing?: A Little Help from another person to put on and taking off regular lower body clothing?: A Little 6 Click Score: 18   End of Session Equipment Utilized During Treatment: Rolling walker (2 wheels) Nurse Communication: Mobility status  Activity Tolerance: Patient tolerated treatment well Patient left: in chair;with call bell/phone within reach  OT Visit Diagnosis: Unsteadiness on feet (R26.81);Other abnormalities of gait and mobility (R26.89);Muscle weakness (generalized) (M62.81);Pain Pain - part of body:  (Back)                Time: 9164-9097 OT Time Calculation (min): 27 min Charges:  OT General Charges $OT Visit: 1 Visit OT Evaluation $OT Eval Moderate Complexity: 1 Mod OT Treatments $Self Care/Home Management : 8-22 mins  Ronal Gift E. Karey Suthers, OTR/L Acute Rehabilitation Services [phone]   Ronal Gift Salt 03/16/2024, 9:35 AM

## 2024-03-16 NOTE — Op Note (Unsigned)
 NAME: Debbie Montoya, Debbie M. MEDICAL RECORD NO: [id number] ACCOUNT NO: [id number] DATE OF BIRTH: 1936-04-25 FACILITY: [facility] LOCATION: [id number] PHYSICIAN: Reyes KYM Billing, MD  Operative Report   DATE OF PROCEDURE: 03/15/2024  PREOPERATIVE DIAGNOSIS:  Status post lumbar fusion L3-L4 with adjacent segment spinal stenosis at L3-L4.  POSTOPERATIVE DIAGNOSES:  Status post lumbar fusion L3-L4 with adjacent segment spinal stenosis at L3-L4, synovial cyst L4-L5 right.  PROCEDURE PERFORMED: 1.  Central laminectomy at L3-L4 with bilateral L3 and L4 foraminotomies. 2.  Excision of synovial cyst. 3.  Exploration of previous fusion and debridement of presurgical scar tissue at L3-L4.  ANESTHESIA:  General.  ASSISTANT:  Arlyne Randy, PA.  HISTORY:  The patient is an 88 year old status post fusion at L4-L5 instrumented.  She had spinal stenosis at L3-L4 at the adjacent segment.  Predominantly on the right with extension and lateral flexion.  The MRI and CT myelogram showed severe lateral  recess stenosis, particularly at L4-L5 on the right, multifactorial with facet and ligamentum flavum hypertrophy, moderate stenosis centrally.  Failing conservative treatment, she was indicated for decompression at L3-L4.  Evaluation of the fusion just  below.  Risks and benefits were discussed including bleeding, infection, damage to neurovascular structures, no change in symptoms, worsening symptoms of DVT, PE, anesthetic complications, etc.  DESCRIPTION OF PROCEDURE:  The patient was in supine position.  After induction of adequate general anesthesia and 2 g of Kefzol  placed prone to Wilson frame.  All bony prominences were well padded.  Lumbar region was prepped and draped in the usual  sterile fashion.  Two 18-gauge spinal needle was utilized to localize the L3-L4 interspace.  Confirmed with x-ray, incision was made from above the spinous process of L3 to below the spinous process of L4.  Subcutaneous tissue was  dissected.   Electrocautery was utilized to achieve hemostasis.  Previous scar tissue was encountered from the previous surgical dissection.  The dorsal lumbar fascia divided in line with skin incision.  Paraspinous muscles elevated from lamina at L3-L4.   Skeletonizing the lamina.  Scar tissue again encountered from the previous surgery.  I used a curette to skeletonize the lamina of L3, identify the lamina of L4.  Previous hardware was encountered and exposed at L4 the PLIF pedicle screws.  They were  intact.  I used a Kocher on the spinous process of L4 and L5.  There was no mobility with attempted motion.  Effusion appeared solid.  Next, after removing the spinous process of L3 and a partial of L4, I used a high-speed bur to debulk the lamina of L4  as well as a Gaffer.  A straight curette was utilized to attach the ligamentum flavum from the cephalad edge of L4.  Hypertrophic ligamentum flavum was noted.  This was removed with a pituitary.  I continued the laminotomy cephalad with a 2 mm  Kerrison to the point cephalad detaching the ligamentum flavum.  Then with the [facility] developed the plane between the thecal sac and the ligamentum flavum gently.  I then sequentially began removing ligamentum flavum from the lateral recesses  bilaterally.  Using a combination of a Woodson for protection of the neural elements and neural paddies as well as a curette and a 3 and 2 mm Kerrisons.  Removing ligamentum centrally there was a synovial cyst that was on the right side seemingly  associated with the L3-L4 facet extending into the ligamentum flavum, developed a plane between the thecal sac and the cyst in the ligamentum  flavum and removed that.  I decompressed the lateral recess to the medial border of the pedicle.  There was  severe stenosis noted here compressing the L4 root.  I performed a foraminotomy at L4.  Electrocautery was utilized to achieve hemostasis.  No disc herniation.  Foraminotomy of L3  was performed.  I then decompressed the left side in a similar fashion,  decompressing the lateral recess to the medial border of the pedicle, developing first a plane between the dura and the facet and hypertrophic ligamentum.  Decompressed again lateral recess and the medial border of the pedicle.  Foraminotomy of L4 was  performed at L3.  Following this, there was good restoration of the thecal sac.  A Woodson probe passed freely out the foramen of L4 and L3, above the pedicle of L3, below the pedicle of L4.  Bipolar cautery was utilized to achieve strict hemostasis.   Confirmatory radiographs obtained with a Woodson in the foramen of 3.  Bone wax was placed on the cancellous surfaces.  I copiously irrigated the wound.  Inspection revealed no evidence of CSF leakage or active bleeding.  Removed the McCulloch retractor,  irrigated the paraspinous musculature.  Bipolar electrocautery was utilized to achieve strict hemostasis.  Thrombin  soaked Gelfoam was placed in the laminotomy defect and then retrieved.  No active bleeding was noted.  I closed the dorsal lumbar fascia  with 1-0 Vicryl interrupted figure-of-eight sutures, subcutaneous with 2-0 and skin with staples, and wound was dressed sterilely, placed supine on the hospital bed, extubated without difficulty, and transported to the recovery room in satisfactory  condition.  The patient tolerated the procedure well with no complications.  Assistant, Jaqueline Bissell, PA was used throughout the case in the patient positioning, gentle intermittent neural traction, closure and suction.  BLOOD LOSS:  75 mL.   PUS D: 03/15/2024 9:40:35 am T: 03/16/2024 1:26:00 am  JOB: [id number]

## 2024-03-18 DIAGNOSIS — M4316 Spondylolisthesis, lumbar region: Secondary | ICD-10-CM | POA: Diagnosis not present

## 2024-03-18 DIAGNOSIS — Z9181 History of falling: Secondary | ICD-10-CM | POA: Diagnosis not present

## 2024-03-18 DIAGNOSIS — Z4789 Encounter for other orthopedic aftercare: Secondary | ICD-10-CM | POA: Diagnosis not present

## 2024-03-18 DIAGNOSIS — Z7982 Long term (current) use of aspirin: Secondary | ICD-10-CM | POA: Diagnosis not present

## 2024-03-18 DIAGNOSIS — Z8673 Personal history of transient ischemic attack (TIA), and cerebral infarction without residual deficits: Secondary | ICD-10-CM | POA: Diagnosis not present

## 2024-03-18 DIAGNOSIS — M51379 Other intervertebral disc degeneration, lumbosacral region without mention of lumbar back pain or lower extremity pain: Secondary | ICD-10-CM | POA: Diagnosis not present

## 2024-03-18 DIAGNOSIS — Z981 Arthrodesis status: Secondary | ICD-10-CM | POA: Diagnosis not present

## 2024-03-18 DIAGNOSIS — E1142 Type 2 diabetes mellitus with diabetic polyneuropathy: Secondary | ICD-10-CM | POA: Diagnosis not present

## 2024-03-18 DIAGNOSIS — K219 Gastro-esophageal reflux disease without esophagitis: Secondary | ICD-10-CM | POA: Diagnosis not present

## 2024-03-18 DIAGNOSIS — M48062 Spinal stenosis, lumbar region with neurogenic claudication: Secondary | ICD-10-CM | POA: Diagnosis not present

## 2024-03-18 DIAGNOSIS — M4726 Other spondylosis with radiculopathy, lumbar region: Secondary | ICD-10-CM | POA: Diagnosis not present

## 2024-04-18 DIAGNOSIS — B009 Herpesviral infection, unspecified: Secondary | ICD-10-CM | POA: Diagnosis not present

## 2024-04-18 DIAGNOSIS — K5900 Constipation, unspecified: Secondary | ICD-10-CM | POA: Diagnosis not present

## 2024-04-18 DIAGNOSIS — G8929 Other chronic pain: Secondary | ICD-10-CM | POA: Diagnosis not present

## 2024-04-18 DIAGNOSIS — E1169 Type 2 diabetes mellitus with other specified complication: Secondary | ICD-10-CM | POA: Diagnosis not present

## 2024-04-18 DIAGNOSIS — F419 Anxiety disorder, unspecified: Secondary | ICD-10-CM | POA: Diagnosis not present

## 2024-04-18 DIAGNOSIS — E785 Hyperlipidemia, unspecified: Secondary | ICD-10-CM | POA: Diagnosis not present

## 2024-04-18 DIAGNOSIS — G4700 Insomnia, unspecified: Secondary | ICD-10-CM | POA: Diagnosis not present

## 2024-04-18 DIAGNOSIS — J309 Allergic rhinitis, unspecified: Secondary | ICD-10-CM | POA: Diagnosis not present

## 2024-04-18 DIAGNOSIS — K219 Gastro-esophageal reflux disease without esophagitis: Secondary | ICD-10-CM | POA: Diagnosis not present

## 2024-04-18 DIAGNOSIS — H547 Unspecified visual loss: Secondary | ICD-10-CM | POA: Diagnosis not present

## 2024-04-18 DIAGNOSIS — F3341 Major depressive disorder, recurrent, in partial remission: Secondary | ICD-10-CM | POA: Diagnosis not present

## 2024-04-18 DIAGNOSIS — E663 Overweight: Secondary | ICD-10-CM | POA: Diagnosis not present

## 2024-04-30 DIAGNOSIS — M961 Postlaminectomy syndrome, not elsewhere classified: Secondary | ICD-10-CM | POA: Diagnosis not present

## 2024-04-30 DIAGNOSIS — K5903 Drug induced constipation: Secondary | ICD-10-CM | POA: Diagnosis not present

## 2024-04-30 DIAGNOSIS — T402X5A Adverse effect of other opioids, initial encounter: Secondary | ICD-10-CM | POA: Diagnosis not present

## 2024-04-30 DIAGNOSIS — Z5181 Encounter for therapeutic drug level monitoring: Secondary | ICD-10-CM | POA: Diagnosis not present

## 2024-05-22 DIAGNOSIS — Z4889 Encounter for other specified surgical aftercare: Secondary | ICD-10-CM | POA: Diagnosis not present

## 2024-06-05 DIAGNOSIS — F322 Major depressive disorder, single episode, severe without psychotic features: Secondary | ICD-10-CM | POA: Diagnosis not present

## 2024-06-10 DIAGNOSIS — M5459 Other low back pain: Secondary | ICD-10-CM | POA: Diagnosis not present

## 2024-06-21 DIAGNOSIS — M5459 Other low back pain: Secondary | ICD-10-CM | POA: Diagnosis not present

## 2024-07-04 ENCOUNTER — Other Ambulatory Visit: Payer: Self-pay | Admitting: Internal Medicine

## 2024-07-04 DIAGNOSIS — Z1231 Encounter for screening mammogram for malignant neoplasm of breast: Secondary | ICD-10-CM

## 2024-07-09 ENCOUNTER — Ambulatory Visit

## 2024-07-10 ENCOUNTER — Inpatient Hospital Stay: Admission: RE | Admit: 2024-07-10 | Discharge: 2024-07-10 | Attending: Internal Medicine | Admitting: Internal Medicine

## 2024-07-10 DIAGNOSIS — Z1231 Encounter for screening mammogram for malignant neoplasm of breast: Secondary | ICD-10-CM

## 2024-12-03 ENCOUNTER — Ambulatory Visit: Admitting: Diagnostic Neuroimaging
